# Patient Record
Sex: Male | Born: 1940 | Race: Black or African American | Hispanic: No | Marital: Married | Smoking: Never smoker
Health system: Southern US, Community
[De-identification: ages and names within clinical notes are randomized; demographics above are authoritative.]

## PROBLEM LIST (undated history)

## (undated) DIAGNOSIS — Z9221 Personal history of antineoplastic chemotherapy: Secondary | ICD-10-CM

## (undated) DIAGNOSIS — Z8719 Personal history of other diseases of the digestive system: Secondary | ICD-10-CM

## (undated) DIAGNOSIS — Z923 Personal history of irradiation: Secondary | ICD-10-CM

## (undated) DIAGNOSIS — C801 Malignant (primary) neoplasm, unspecified: Secondary | ICD-10-CM

## (undated) DIAGNOSIS — C20 Malignant neoplasm of rectum: Secondary | ICD-10-CM

## (undated) HISTORY — PX: OTHER SURGICAL HISTORY: SHX169

---

## 2016-02-26 DIAGNOSIS — K4090 Unilateral inguinal hernia, without obstruction or gangrene, not specified as recurrent: Secondary | ICD-10-CM | POA: Diagnosis not present

## 2016-02-26 DIAGNOSIS — R1031 Right lower quadrant pain: Secondary | ICD-10-CM | POA: Diagnosis not present

## 2016-03-05 DIAGNOSIS — K4090 Unilateral inguinal hernia, without obstruction or gangrene, not specified as recurrent: Secondary | ICD-10-CM | POA: Diagnosis not present

## 2016-03-05 HISTORY — PX: HERNIA REPAIR: SHX51

## 2017-07-21 DIAGNOSIS — I10 Essential (primary) hypertension: Secondary | ICD-10-CM | POA: Diagnosis not present

## 2017-07-21 DIAGNOSIS — N5201 Erectile dysfunction due to arterial insufficiency: Secondary | ICD-10-CM | POA: Diagnosis not present

## 2017-08-11 DIAGNOSIS — Z6828 Body mass index (BMI) 28.0-28.9, adult: Secondary | ICD-10-CM | POA: Diagnosis not present

## 2017-08-11 DIAGNOSIS — Z0000 Encounter for general adult medical examination without abnormal findings: Secondary | ICD-10-CM | POA: Diagnosis not present

## 2017-08-11 DIAGNOSIS — R030 Elevated blood-pressure reading, without diagnosis of hypertension: Secondary | ICD-10-CM | POA: Diagnosis not present

## 2018-04-04 ENCOUNTER — Other Ambulatory Visit: Payer: Self-pay | Admitting: Surgery

## 2018-04-04 DIAGNOSIS — C20 Malignant neoplasm of rectum: Secondary | ICD-10-CM

## 2018-04-06 DIAGNOSIS — Z800 Family history of malignant neoplasm of digestive organs: Secondary | ICD-10-CM | POA: Diagnosis not present

## 2018-04-06 DIAGNOSIS — Z808 Family history of malignant neoplasm of other organs or systems: Secondary | ICD-10-CM

## 2018-04-06 DIAGNOSIS — Z803 Family history of malignant neoplasm of breast: Secondary | ICD-10-CM

## 2018-04-06 DIAGNOSIS — C20 Malignant neoplasm of rectum: Secondary | ICD-10-CM

## 2018-04-11 ENCOUNTER — Ambulatory Visit
Admission: RE | Admit: 2018-04-11 | Discharge: 2018-04-11 | Disposition: A | Discharge status: Home / Self Care | Payer: Medicare Other | Source: Ambulatory Visit | Attending: Surgery | Admitting: Surgery

## 2018-04-11 DIAGNOSIS — C20 Malignant neoplasm of rectum: Secondary | ICD-10-CM

## 2018-04-11 MED ORDER — GADOBENATE DIMEGLUMINE 529 MG/ML IV SOLN
18.0000 mL | Freq: Once | INTRAVENOUS | Status: AC | PRN
  Administered 2018-04-11: 18 mL via INTRAVENOUS

## 2018-04-14 ENCOUNTER — Other Ambulatory Visit: Payer: Self-pay

## 2018-05-08 DIAGNOSIS — C20 Malignant neoplasm of rectum: Secondary | ICD-10-CM

## 2018-05-22 DIAGNOSIS — C20 Malignant neoplasm of rectum: Secondary | ICD-10-CM

## 2018-06-13 ENCOUNTER — Ambulatory Visit: Payer: Self-pay | Admitting: General Surgery

## 2018-06-13 NOTE — H&P (Signed)
History of Present Illness Thomas Ruff MD; 6/76/7209 11:48 AM) The patient is a 77 year old male who presents with colorectal cancer. 77 year old male who presents to the office for evaluation of recently diagnosed rectal cancer. He underwent a colonoscopy in June 2019 which showed a distal rectal mass approximately 2-3 cm from the anal verge. Biopsy show adenocarcinoma. CEA was elevated at 6.4. CT scan show no signs of metastatic disease. It does not appear that the tumor was tattooed. He underwent an MRI which showed a T3 N0 mass with no externalization of the tumor outside of the mesorectal envelope. This mass appears to be at the level of the prostate and is most invasive at the right posterior border. He denies any urinary symptoms. He reports some mild constipation and denies any rectal bleeding currently. He has noticed a 25 pound weight loss over the past few months which she attributes to decrease in appetite during chemotherapy.   Diagnostic Studies History (Thomas Mejia, Thomas Mejia; 06/13/2018 11:16 AM) Colonoscopy within last year  Allergies (Thomas Mejia, Thomas Mejia; 06/13/2018 11:17 AM) No Known Drug Allergies [06/13/2018]: Allergies Reconciled  Medication History (Thomas Mejia, Thomas Mejia; 06/13/2018 11:19 AM) Fish Oil (1000MG  Capsule, Oral) Active. Garlic (100MG  Tablet, Oral) Active. Magnesium (400MG  Capsule, Oral) Active. Multi-Day (Oral) Active. Vitamin B-12 (1000MCG Tablet, Oral) Active. Medications Reconciled  Social History (Thomas Mejia, [name]; 06/13/2018 11:16 AM) Caffeine use Carbonated beverages. No alcohol use No drug use Tobacco use Never smoker.  Other Problems (Thomas Mejia, Thomas Mejia; 06/13/2018 11:16 AM) Rectal Cancer     Review of Systems (Thomas A. Thomas Mejia; 06/13/2018 11:16 AM) General Present- Appetite Loss and Weight Loss. Not Present- Chills, Fatigue, Fever, Night Sweats and Weight Gain. Skin Present- Dryness. Not Present-  Change in Wart/Mole, Hives, Jaundice, New Lesions, Non-Healing Wounds, Rash and Ulcer. HEENT Present- Hoarseness, Seasonal Allergies and Sore Throat. Not Present- Earache, Hearing Loss, Nose Bleed, Oral Ulcers, Ringing in the Ears, Sinus Pain, Visual Disturbances, Wears glasses/contact lenses and Yellow Eyes. Respiratory Not Present- Bloody sputum, Chronic Cough, Difficulty Breathing, Snoring and Wheezing. Breast Not Present- Breast Mass, Breast Pain, Nipple Discharge and Skin Changes. Cardiovascular Not Present- Chest Pain, Difficulty Breathing Lying Down, Leg Cramps, Palpitations, Rapid Heart Rate, Shortness of Breath and Swelling of Extremities. Gastrointestinal Present- Change in Bowel Habits. Not Present- Abdominal Pain, Bloating, Bloody Stool, Chronic diarrhea, Constipation, Difficulty Swallowing, Excessive gas, Gets full quickly at meals, Hemorrhoids, Indigestion, Nausea, Rectal Pain and Vomiting. Male Genitourinary Not Present- Blood in Urine, Change in Urinary Stream, Frequency, Impotence, Nocturia, Painful Urination, Urgency and Urine Leakage. Musculoskeletal Not Present- Back Pain, Joint Pain, Joint Stiffness, Muscle Pain, Muscle Weakness and Swelling of Extremities. Neurological Not Present- Decreased Memory, Fainting, Headaches, Numbness, Seizures, Tingling, Tremor, Trouble walking and Weakness. Psychiatric Not Present- Anxiety, Bipolar, Change in Sleep Pattern, Depression, Fearful and Frequent crying. Endocrine Not Present- Cold Intolerance, Excessive Hunger, Hair Changes, Heat Intolerance, Hot flashes and New Diabetes. Hematology Not Present- Blood Thinners, Easy Bruising, Excessive bleeding, Gland problems, HIV and Persistent Infections.  Vitals (Thomas A. Thomas Mejia; 06/13/2018 11:17 AM) 06/13/2018 11:17 AM Weight: 173.4 lb Height: 73in Body Surface Area: 2.03 m Body Mass Index: 22.88 kg/m  Temp.: 98.58F  Pulse: 90 (Regular)  BP: 128/72 (Sitting, Left Arm,  Standard)      Physical Exam Thomas Ruff MD; 4/70/9628 11:49 AM)  General Mental Status-Alert. General Appearance-Not in acute distress. Build & Nutrition-Well nourished. Posture-Normal posture. Gait-Normal.  Head and Neck Head-normocephalic, atraumatic  with no lesions or palpable masses. Trachea-midline.  Chest and Lung Exam Chest and lung exam reveals -on auscultation, normal breath sounds, no adventitious sounds and normal vocal resonance.  Cardiovascular Cardiovascular examination reveals -normal heart sounds, regular rate and rhythm with no murmurs and no digital clubbing, cyanosis, edema, increased warmth or tenderness.  Abdomen Inspection Inspection of the abdomen reveals - No Hernias. Palpation/Percussion Palpation and Percussion of the abdomen reveal - Soft, Non Tender, No Rigidity (guarding), No hepatosplenomegaly and No Palpable abdominal masses.  Rectal Anorectal Exam Internal - normal sphincter tone. Note: No mass palpable to approximately 3 cm proximal to the anal verge.  Neurologic Neurologic evaluation reveals -alert and oriented x 3 with no impairment of recent or remote memory, normal attention span and ability to concentrate, normal sensation and normal coordination.  Musculoskeletal Normal Exam - Bilateral-Upper Extremity Strength Normal and Lower Extremity Strength Normal.    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 He completed his radiation and chemotherapy on 06/01/2018. He has lost some weight during his treatment which she is working to gain back. Otherwise he is relatively healthy. We discussed the surgery in detail including potential complications and postoperative quality of life. He is elected to proceed with low anterior resection. He is aware that he will most likely need a  diverting ostomy temporarily. We also specifically discussed the risk of damage to the prostate urethra and nerves surrounding the area which could lead to injuries in this area as well as nerve damage causing sexual dysfunction and bladder dysfunction. We will plan on scheduling the surgery in the beginning of October.  The surgery and anatomy were described to the patient as well as the risks of surgery and the possible complications. These include: Bleeding, deep abdominal infections and possible wound complications such as hernia and infection, damage to adjacent structures, leak of surgical connections, which can lead to other surgeries and possibly an ostomy, possible need for other procedures, such as abscess drains in radiology, possible prolonged hospital stay, possible diarrhea from removal of part of the colon, possible constipation from narcotics, possible bowel, bladder or sexual dysfunction if having rectal surgery, prolonged fatigue/weakness or appetite loss, possible early recurrence of of disease, possible complications of their medical problems such as heart disease or arrhythmias or lung problems, death (less than 1%). I believe the patient understands and wishes to proceed with the surgery.

## 2018-06-29 DIAGNOSIS — Z923 Personal history of irradiation: Secondary | ICD-10-CM

## 2018-06-29 DIAGNOSIS — Z9221 Personal history of antineoplastic chemotherapy: Secondary | ICD-10-CM

## 2018-06-29 DIAGNOSIS — C20 Malignant neoplasm of rectum: Secondary | ICD-10-CM | POA: Diagnosis not present

## 2018-07-06 NOTE — Patient Instructions (Addendum)
Thomas Mejia  07/06/2018   Your procedure is scheduled on: 07-21-18    Report to [facility] Main  Entrance    Report to Admitting at 7:00 AM    Call this number if you have problems the morning of surgery [phone]    Remember: DRINK 2 PRESURGERY ENSURE DRINKS THE NIGHT BEFORE SURGERY AT  1000 PM AND 1 PRESURGERY DRINK THE DAY OF THE PROCEDURE 3 HOURS PRIOR TO SCHEDULED SURGERY. NO SOLIDS AFTER MIDNIGHT THE DAY PRIOR TO THE SURGERY. NOTHING BY MOUTH EXCEPT CLEAR LIQUIDS UNTIL THREE HOURS PRIOR TO SCHEDULED SURGERY. PLEASE FINISH PRESURGERY ENSURE DRINK PER SURGEON ORDER 3 HOURS PRIOR TO SCHEDULED SURGERY TIME WHICH NEEDS TO BE COMPLETED AT 6:00 AM .     CLEAR LIQUID DIET   Foods Allowed                                                                     Foods Excluded  Coffee and tea, regular and decaf                             liquids that you cannot  Plain Jell-O in any flavor                                             see through such as: Fruit ices (not with fruit pulp)                                     milk, soups, orange juice  Iced Popsicles                                    All solid food Carbonated beverages, regular and diet                                    Cranberry, grape and apple juices Sports drinks like Gatorade Lightly seasoned clear broth or consume(fat free) Sugar, honey syrup  Sample Menu Breakfast                                Lunch                                     Supper Cranberry juice                    Beef broth                            Chicken broth Jell-O  Grape juice                           Apple juice Coffee or tea                        Jell-O                                      Popsicle                                                Coffee or tea                        Coffee or tea  _____________________________________________________________________     BRUSH  YOUR TEETH MORNING OF SURGERY AND RINSE YOUR MOUTH OUT, NO CHEWING GUM CANDY OR MINTS.     Take these medicines the morning of surgery with A SIP OF WATER: You may use your Gabapentin, prn. You may also bring and use your eyedrops                                You may not have any metal on your body including hair pins and              piercings  Do not wear jewelry, lotions, powders, cologne or deodorant             Men may shave face and neck.   Do not bring valuables to the hospital. [place].  Contacts, dentures or bridgework may not be worn into surgery.  Leave suitcase in the car. After surgery it may be brought to your room.      Special Instructions: Please consume a Clear Liquid Diet the Day of your prep, per your surgeon's instructions              Please read over the following fact sheets you were given: _____________________________________________________________________  [facility] - Preparing for Surgery Before surgery, you can play an important role.  Because skin is not sterile, your skin needs to be as free of germs as possible.  You can reduce the number of germs on your skin by washing with CHG (chlorahexidine gluconate) soap before surgery.  CHG is an antiseptic cleaner which kills germs and bonds with the skin to continue killing germs even after washing. Please DO NOT use if you have an allergy to CHG or antibacterial soaps.  If your skin becomes reddened/irritated stop using the CHG and inform your nurse when you arrive at Short Stay. Do not shave (including legs and underarms) for at least 48 hours prior to the first CHG shower.  You may shave your face/neck. Please follow these instructions carefully:  1.  Shower with CHG Soap the night before surgery and the  morning of Surgery.  2.  If you choose to wash your hair, wash your hair first as usual with your  normal  shampoo.  3.  After you shampoo, rinse your  hair and body  thoroughly to remove the  shampoo.                           4.  Use CHG as you would any other liquid soap.  You can apply chg directly  to the skin and wash                       Gently with a scrungie or clean washcloth.  5.  Apply the CHG Soap to your body ONLY FROM THE NECK DOWN.   Do not use on face/ open                           Wound or open sores. Avoid contact with eyes, ears mouth and genitals (private parts).                       Wash face,  Genitals (private parts) with your normal soap.             6.  Wash thoroughly, paying special attention to the area where your surgery  will be performed.  7.  Thoroughly rinse your body with warm water from the neck down.  8.  DO NOT shower/wash with your normal soap after using and rinsing off  the CHG Soap.                9.  Pat yourself dry with a clean towel.            10.  Wear clean pajamas.            11.  Place clean sheets on your bed the night of your first shower and do not  sleep with pets. Day of Surgery : Do not apply any lotions/deodorants the morning of surgery.  Please wear clean clothes to the hospital/surgery center.  FAILURE TO FOLLOW THESE INSTRUCTIONS MAY RESULT IN THE CANCELLATION OF YOUR SURGERY PATIENT SIGNATURE_________________________________  NURSE SIGNATURE__________________________________  ________________________________________________________________________   Adam Phenix  An incentive spirometer is a tool that can help keep your lungs clear and active. This tool measures how well you are filling your lungs with each breath. Taking long deep breaths may help reverse or decrease the chance of developing breathing (pulmonary) problems (especially infection) following:  A long period of time when you are unable to move or be active. BEFORE THE PROCEDURE   If the spirometer includes an indicator to show your best effort, your nurse or respiratory therapist will set it to a  desired goal.  If possible, sit up straight or lean slightly forward. Try not to slouch.  Hold the incentive spirometer in an upright position. INSTRUCTIONS FOR USE  1. Sit on the edge of your bed if possible, or sit up as far as you can in bed or on a chair. 2. Hold the incentive spirometer in an upright position. 3. Breathe out normally. 4. Place the mouthpiece in your mouth and seal your lips tightly around it. 5. Breathe in slowly and as deeply as possible, raising the piston or the ball toward the top of the column. 6. Hold your breath for 3-5 seconds or for as long as possible. Allow the piston or ball to fall to the bottom of the column. 7. Remove the mouthpiece from your mouth and breathe out normally. 8. Rest for a few seconds and repeat Steps 1 through 7  at least 10 times every 1-2 hours when you are awake. Take your time and take a few normal breaths between deep breaths. 9. The spirometer may include an indicator to show your best effort. Use the indicator as a goal to work toward during each repetition. 10. After each set of 10 deep breaths, practice coughing to be sure your lungs are clear. If you have an incision (the cut made at the time of surgery), support your incision when coughing by placing a pillow or rolled up towels firmly against it. Once you are able to get out of bed, walk around indoors and cough well. You may stop using the incentive spirometer when instructed by your caregiver.  RISKS AND COMPLICATIONS  Take your time so you do not get dizzy or light-headed.  If you are in pain, you may need to take or ask for pain medication before doing incentive spirometry. It is harder to take a deep breath if you are having pain. AFTER USE  Rest and breathe slowly and easily.  It can be helpful to keep track of a log of your progress. Your caregiver can provide you with a simple table to help with this. If you are using the spirometer at home, follow these  instructions: [place] IF:   You are having difficultly using the spirometer.  You have trouble using the spirometer as often as instructed.  Your pain medication is not giving enough relief while using the spirometer.  You develop fever of 100.5 F (38.1 C) or higher. SEEK IMMEDIATE MEDICAL CARE IF:   You cough up bloody sputum that had not been present before.  You develop fever of 102 F (38.9 C) or greater.  You develop worsening pain at or near the incision site. MAKE SURE YOU:   Understand these instructions.  Will watch your condition.  Will get help right away if you are not doing well or get worse. Document Released: 02/14/2007 Document Revised: 12/27/2011 Document Reviewed: 04/17/2007 Exit[employer] Patient Information 2014 Exit[employer], [place].   ________________________________________________________________________             WHAT IS A BLOOD TRANSFUSION? Blood Transfusion Information  A transfusion is the replacement of blood or some of its parts. Blood is made up of multiple cells which provide different functions.  Red blood cells carry oxygen and are used for blood loss replacement.  White blood cells fight against infection.  Platelets control bleeding.  Plasma helps clot blood.  Other blood products are available for specialized needs, such as hemophilia or other clotting disorders. BEFORE THE TRANSFUSION  Who gives blood for transfusions?   Healthy volunteers who are fully evaluated to make sure their blood is safe. This is blood bank blood. Transfusion therapy is the safest it has ever been in the practice of medicine. Before blood is taken from a donor, a complete history is taken to make sure that person has no history of diseases nor engages in risky social behavior (examples are intravenous drug use or sexual activity with multiple partners). The donor's travel history is screened to minimize risk of transmitting infections, such as malaria.  The donated blood is tested for signs of infectious diseases, such as HIV and hepatitis. The blood is then tested to be sure it is compatible with you in order to minimize the chance of a transfusion reaction. If you or a relative donates blood, this is often done in anticipation of surgery and is not appropriate for emergency situations. It takes  many days to process the donated blood. RISKS AND COMPLICATIONS Although transfusion therapy is very safe and saves many lives, the main dangers of transfusion include:   Getting an infectious disease.  Developing a transfusion reaction. This is an allergic reaction to something in the blood you were given. Every precaution is taken to prevent this. The decision to have a blood transfusion has been considered carefully by your caregiver before blood is given. Blood is not given unless the benefits outweigh the risks. AFTER THE TRANSFUSION  Right after receiving a blood transfusion, you will usually feel much better and more energetic. This is especially true if your red blood cells have gotten low (anemic). The transfusion raises the level of the red blood cells which carry oxygen, and this usually causes an energy increase.  The nurse administering the transfusion will monitor you carefully for complications. HOME CARE INSTRUCTIONS  No special instructions are needed after a transfusion. You may find your energy is better. Speak with your caregiver about any limitations on activity for underlying diseases you may have. SEEK MEDICAL CARE IF:   Your condition is not improving after your transfusion.  You develop redness or irritation at the intravenous (IV) site. SEEK IMMEDIATE MEDICAL CARE IF:  Any of the following symptoms occur over the next 12 hours:  Shaking chills.  You have a temperature by mouth above 102 F (38.9 C), not controlled by medicine.  Chest, back, or muscle pain.  People around you feel you are not acting correctly or are  confused.  Shortness of breath or difficulty breathing.  Dizziness and fainting.  You get a rash or develop hives.  You have a decrease in urine output.  Your urine turns a dark color or changes to pink, red, or brown. Any of the following symptoms occur over the next 10 days:  You have a temperature by mouth above 102 F (38.9 C), not controlled by medicine.  Shortness of breath.  Weakness after normal activity.  The white part of the eye turns yellow (jaundice).  You have a decrease in the amount of urine or are urinating less often.  Your urine turns a dark color or changes to pink, red, or brown. Document Released: 10/01/2000 Document Revised: 12/27/2011 Document Reviewed: 05/20/2008 [facility] Patient Information 2014 [place], [place].  _______________________________________________________________________

## 2018-07-11 ENCOUNTER — Encounter (HOSPITAL_COMMUNITY): Payer: Self-pay

## 2018-07-11 ENCOUNTER — Other Ambulatory Visit: Payer: Self-pay

## 2018-07-11 ENCOUNTER — Encounter (HOSPITAL_COMMUNITY)
Admission: RE | Admit: 2018-07-11 | Discharge: 2018-07-11 | Disposition: A | Discharge status: Home / Self Care | Payer: Medicare Other | Source: Ambulatory Visit | Attending: General Surgery | Admitting: General Surgery

## 2018-07-11 DIAGNOSIS — Z01818 Encounter for other preprocedural examination: Secondary | ICD-10-CM | POA: Insufficient documentation

## 2018-07-11 DIAGNOSIS — C20 Malignant neoplasm of rectum: Secondary | ICD-10-CM | POA: Diagnosis not present

## 2018-07-11 HISTORY — DX: Malignant (primary) neoplasm, unspecified: C80.1

## 2018-07-11 LAB — BASIC METABOLIC PANEL
ANION GAP: 7 (ref 5–15)
BUN: 16 mg/dL (ref 8–23)
CALCIUM: 9.1 mg/dL (ref 8.9–10.3)
CO2: 26 mmol/L (ref 22–32)
Chloride: 110 mmol/L (ref 98–111)
Creatinine, Ser: 0.92 mg/dL (ref 0.61–1.24)
GFR calc Af Amer: >60 mL/min (ref >60)
GLUCOSE: 104 mg/dL — AB (ref 70–99)
POTASSIUM: 3.9 mmol/L (ref 3.5–5.1)
SODIUM: 143 mmol/L (ref 135–145)

## 2018-07-11 LAB — CBC
HCT: 36.4 % — ABNORMAL LOW (ref 39.0–52.0)
HEMOGLOBIN: 11.5 g/dL — AB (ref 13.0–17.0)
MCH: 27.1 pg (ref 26.0–34.0)
MCHC: 31.6 g/dL (ref 30.0–36.0)
MCV: 85.8 fL (ref 78.0–100.0)
Platelets: 189 10*3/uL (ref 150–400)
RBC: 4.24 MIL/uL (ref 4.22–5.81)
RDW: 24.3 % — AB (ref 11.5–15.5)
WBC: 3 10*3/uL — AB (ref 4.0–10.5)

## 2018-07-11 NOTE — Consult Note (Addendum)
[place] Nurse requested for preoperative stoma site marking  Discussed surgical procedure and stoma creation with patient.  Explained role of the [facility] nurse team.  Provided the patient with educational booklet and provided samples of pouching options.  Answered patient's questions.   Examined patient sitting, and standing in order to place the marking in the patient's visual field, away from any creases or abdominal contour issues and within the rectus muscle.  Attempted to mark below the patient's belt line; this was not possible and marks had to be placed higher on the abd related to several creases which occur when pt leans forward and should be avoided if possible.   Marked for colostomy in the LLQ  __10__ cm to the left of the umbilicus and __3__GH above the umbilicus.  Marked for ileostomy in the RLQ  _10___cm to the right of the umbilicus and  __8__ cm above the umbilicus.  Patient's abdomen cleansed with CHG wipes at site markings, allowed to air dry prior to marking.Covered mark with thin film transparent dressings to preserve mark until date of surgery. Provided patient with marking pen to re-color in the sites if they begin to fade before the surgery.  [place] Nurse team will follow up with patient after surgery for continue ostomy care and teaching.   Julien Girt MSN, RN, [place], [place], [place]

## 2018-07-12 LAB — ABO/RH: ABO/RH(D): O POS

## 2018-07-20 MED ORDER — BUPIVACAINE LIPOSOME 1.3 % IJ SUSP
20.0000 mL | Freq: Once | INTRAMUSCULAR | Status: DC
  Filled 2018-07-20: qty 20

## 2018-07-21 ENCOUNTER — Inpatient Hospital Stay (HOSPITAL_COMMUNITY): Payer: Medicare Other | Admitting: Certified Registered Nurse Anesthetist

## 2018-07-21 ENCOUNTER — Encounter (HOSPITAL_COMMUNITY): Payer: Self-pay | Admitting: Certified Registered Nurse Anesthetist

## 2018-07-21 ENCOUNTER — Other Ambulatory Visit: Payer: Self-pay

## 2018-07-21 ENCOUNTER — Inpatient Hospital Stay (HOSPITAL_COMMUNITY)
Admission: RE | Admit: 2018-07-21 | Discharge: 2018-07-26 | DRG: 330 | Disposition: A | Discharge status: Home Health | Payer: Medicare Other | Source: Ambulatory Visit | Attending: General Surgery | Admitting: General Surgery

## 2018-07-21 ENCOUNTER — Encounter (HOSPITAL_COMMUNITY)
Admission: RE | Disposition: A | Discharge status: Home Health | Payer: Self-pay | Source: Ambulatory Visit | Attending: General Surgery

## 2018-07-21 DIAGNOSIS — D62 Acute posthemorrhagic anemia: Secondary | ICD-10-CM | POA: Diagnosis present

## 2018-07-21 DIAGNOSIS — Y842 Radiological procedure and radiotherapy as the cause of abnormal reaction of the patient, or of later complication, without mention of misadventure at the time of the procedure: Secondary | ICD-10-CM | POA: Diagnosis present

## 2018-07-21 DIAGNOSIS — I959 Hypotension, unspecified: Secondary | ICD-10-CM | POA: Diagnosis not present

## 2018-07-21 DIAGNOSIS — C20 Malignant neoplasm of rectum: Secondary | ICD-10-CM | POA: Diagnosis present

## 2018-07-21 DIAGNOSIS — K567 Ileus, unspecified: Secondary | ICD-10-CM | POA: Diagnosis not present

## 2018-07-21 HISTORY — PX: PROCTOSCOPY: SHX2266

## 2018-07-21 HISTORY — PX: XI ROBOTIC ASSISTED LOWER ANTERIOR RESECTION: SHX6558

## 2018-07-21 SURGERY — RESECTION, RECTUM, LOW ANTERIOR, ROBOT-ASSISTED
Anesthesia: General

## 2018-07-21 MED ORDER — PHENYLEPHRINE HCL 10 MG/ML IJ SOLN
INTRAMUSCULAR | Status: AC
  Filled 2018-07-21: qty 1

## 2018-07-21 MED ORDER — ONDANSETRON HCL 4 MG/2ML IJ SOLN
INTRAMUSCULAR | Status: DC | PRN
  Administered 2018-07-21: 4 mg via INTRAVENOUS

## 2018-07-21 MED ORDER — ROCURONIUM BROMIDE 10 MG/ML (PF) SYRINGE
PREFILLED_SYRINGE | INTRAVENOUS | Status: AC
  Filled 2018-07-21: qty 10

## 2018-07-21 MED ORDER — DEXAMETHASONE SODIUM PHOSPHATE 10 MG/ML IJ SOLN
INTRAMUSCULAR | Status: DC | PRN
  Administered 2018-07-21: 8 mg via INTRAVENOUS

## 2018-07-21 MED ORDER — SODIUM CHLORIDE 0.9 % IV SOLN
2.0000 g | Freq: Two times a day (BID) | INTRAVENOUS | Status: AC
  Administered 2018-07-21: 2 g via INTRAVENOUS
  Filled 2018-07-21: qty 2

## 2018-07-21 MED ORDER — FENTANYL CITRATE (PF) 100 MCG/2ML IJ SOLN
INTRAMUSCULAR | Status: AC
  Filled 2018-07-21: qty 2

## 2018-07-21 MED ORDER — GABAPENTIN 300 MG PO CAPS
300.0000 mg | ORAL_CAPSULE | ORAL | Status: AC
  Administered 2018-07-21: 300 mg via ORAL
  Filled 2018-07-21: qty 1

## 2018-07-21 MED ORDER — ROCURONIUM BROMIDE 50 MG/5ML IV SOSY
PREFILLED_SYRINGE | INTRAVENOUS | Status: DC | PRN
  Administered 2018-07-21: 10 mg via INTRAVENOUS
  Administered 2018-07-21: 50 mg via INTRAVENOUS
  Administered 2018-07-21: 20 mg via INTRAVENOUS
  Administered 2018-07-21: 5 mg via INTRAVENOUS

## 2018-07-21 MED ORDER — PROPOFOL 10 MG/ML IV BOLUS
INTRAVENOUS | Status: AC
  Filled 2018-07-21: qty 20

## 2018-07-21 MED ORDER — FENTANYL CITRATE (PF) 100 MCG/2ML IJ SOLN: 25.0000 ug | INTRAMUSCULAR | Status: DC | PRN

## 2018-07-21 MED ORDER — GABAPENTIN 300 MG PO CAPS
300.0000 mg | ORAL_CAPSULE | Freq: Two times a day (BID) | ORAL | Status: DC
  Administered 2018-07-21 – 2018-07-26 (×11): 300 mg via ORAL
  Filled 2018-07-21 (×11): qty 1

## 2018-07-21 MED ORDER — ONDANSETRON HCL 4 MG/2ML IJ SOLN: 4.0000 mg | Freq: Once | INTRAMUSCULAR | Status: DC | PRN

## 2018-07-21 MED ORDER — BUPIVACAINE-EPINEPHRINE (PF) 0.5% -1:200000 IJ SOLN
INTRAMUSCULAR | Status: AC
  Filled 2018-07-21: qty 30

## 2018-07-21 MED ORDER — 0.9 % SODIUM CHLORIDE (POUR BTL) OPTIME
TOPICAL | Status: DC | PRN
  Administered 2018-07-21: 2000 mL

## 2018-07-21 MED ORDER — SUCCINYLCHOLINE CHLORIDE 200 MG/10ML IV SOSY
PREFILLED_SYRINGE | INTRAVENOUS | Status: AC
  Filled 2018-07-21: qty 10

## 2018-07-21 MED ORDER — KETAMINE HCL 10 MG/ML IJ SOLN
INTRAMUSCULAR | Status: AC
  Filled 2018-07-21: qty 1

## 2018-07-21 MED ORDER — KETAMINE HCL 10 MG/ML IJ SOLN
INTRAMUSCULAR | Status: DC | PRN
  Administered 2018-07-21: 40 mg via INTRAVENOUS

## 2018-07-21 MED ORDER — LIDOCAINE 2% (20 MG/ML) 5 ML SYRINGE
INTRAMUSCULAR | Status: AC
  Filled 2018-07-21: qty 5

## 2018-07-21 MED ORDER — METOPROLOL TARTRATE 5 MG/5ML IV SOLN: 5.0000 mg | Freq: Four times a day (QID) | INTRAVENOUS | Status: DC | PRN

## 2018-07-21 MED ORDER — LABETALOL HCL 5 MG/ML IV SOLN
INTRAVENOUS | Status: AC
  Filled 2018-07-21: qty 4

## 2018-07-21 MED ORDER — PROPOFOL 10 MG/ML IV BOLUS
INTRAVENOUS | Status: DC | PRN
  Administered 2018-07-21: 200 mg via INTRAVENOUS

## 2018-07-21 MED ORDER — DEXAMETHASONE SODIUM PHOSPHATE 10 MG/ML IJ SOLN
INTRAMUSCULAR | Status: AC
  Filled 2018-07-21: qty 1

## 2018-07-21 MED ORDER — HEPARIN SODIUM (PORCINE) 5000 UNIT/ML IJ SOLN
5000.0000 [IU] | Freq: Once | INTRAMUSCULAR | Status: AC
  Administered 2018-07-21: 5000 [IU] via SUBCUTANEOUS

## 2018-07-21 MED ORDER — SACCHAROMYCES BOULARDII 250 MG PO CAPS
250.0000 mg | ORAL_CAPSULE | Freq: Two times a day (BID) | ORAL | Status: DC
  Administered 2018-07-21 – 2018-07-26 (×11): 250 mg via ORAL
  Filled 2018-07-21 (×11): qty 1

## 2018-07-21 MED ORDER — FENTANYL CITRATE (PF) 250 MCG/5ML IJ SOLN
INTRAMUSCULAR | Status: DC | PRN
  Administered 2018-07-21 (×3): 50 ug via INTRAVENOUS
  Administered 2018-07-21: 100 ug via INTRAVENOUS
  Administered 2018-07-21: 50 ug via INTRAVENOUS

## 2018-07-21 MED ORDER — SUGAMMADEX SODIUM 200 MG/2ML IV SOLN
INTRAVENOUS | Status: AC
  Filled 2018-07-21: qty 2

## 2018-07-21 MED ORDER — ALUM & MAG HYDROXIDE-SIMETH 200-200-20 MG/5ML PO SUSP
30.0000 mL | Freq: Four times a day (QID) | ORAL | Status: DC | PRN
  Administered 2018-07-23 – 2018-07-25 (×3): 30 mL via ORAL
  Filled 2018-07-21 (×3): qty 30

## 2018-07-21 MED ORDER — HEPARIN SODIUM (PORCINE) 5000 UNIT/ML IJ SOLN
INTRAMUSCULAR | Status: AC
  Filled 2018-07-21: qty 1

## 2018-07-21 MED ORDER — BUPIVACAINE LIPOSOME 1.3 % IJ SUSP
INTRAMUSCULAR | Status: DC | PRN
  Administered 2018-07-21: 20 mL

## 2018-07-21 MED ORDER — ONDANSETRON HCL 4 MG/2ML IJ SOLN
4.0000 mg | Freq: Four times a day (QID) | INTRAMUSCULAR | Status: DC | PRN
  Administered 2018-07-21: 4 mg via INTRAVENOUS
  Filled 2018-07-21: qty 2

## 2018-07-21 MED ORDER — LIDOCAINE 2% (20 MG/ML) 5 ML SYRINGE
INTRAMUSCULAR | Status: DC | PRN
  Administered 2018-07-21: 100 mg via INTRAVENOUS

## 2018-07-21 MED ORDER — SUGAMMADEX SODIUM 200 MG/2ML IV SOLN
INTRAVENOUS | Status: DC | PRN
  Administered 2018-07-21: 200 mg via INTRAVENOUS

## 2018-07-21 MED ORDER — LACTATED RINGERS IV SOLN
INTRAVENOUS | Status: DC
  Administered 2018-07-21 (×3): via INTRAVENOUS

## 2018-07-21 MED ORDER — ONDANSETRON HCL 4 MG PO TABS: 4.0000 mg | ORAL_TABLET | Freq: Four times a day (QID) | ORAL | Status: DC | PRN

## 2018-07-21 MED ORDER — ENOXAPARIN SODIUM 40 MG/0.4ML ~~LOC~~ SOLN
40.0000 mg | SUBCUTANEOUS | Status: DC
  Administered 2018-07-22: 40 mg via SUBCUTANEOUS
  Filled 2018-07-21: qty 0.4

## 2018-07-21 MED ORDER — LABETALOL HCL 5 MG/ML IV SOLN
INTRAVENOUS | Status: DC | PRN
  Administered 2018-07-21 (×2): 2.5 mg via INTRAVENOUS

## 2018-07-21 MED ORDER — ALVIMOPAN 12 MG PO CAPS
12.0000 mg | ORAL_CAPSULE | Freq: Two times a day (BID) | ORAL | Status: DC
  Administered 2018-07-22 – 2018-07-23 (×3): 12 mg via ORAL
  Filled 2018-07-21 (×3): qty 1

## 2018-07-21 MED ORDER — PHENYLEPHRINE 40 MCG/ML (10ML) SYRINGE FOR IV PUSH (FOR BLOOD PRESSURE SUPPORT)
PREFILLED_SYRINGE | INTRAVENOUS | Status: DC | PRN
  Administered 2018-07-21 (×2): 80 ug via INTRAVENOUS

## 2018-07-21 MED ORDER — ACETAMINOPHEN 500 MG PO TABS
1000.0000 mg | ORAL_TABLET | Freq: Four times a day (QID) | ORAL | Status: DC
  Administered 2018-07-21 – 2018-07-26 (×18): 1000 mg via ORAL
  Filled 2018-07-21 (×20): qty 2

## 2018-07-21 MED ORDER — ALVIMOPAN 12 MG PO CAPS
12.0000 mg | ORAL_CAPSULE | ORAL | Status: AC
  Administered 2018-07-21: 12 mg via ORAL
  Filled 2018-07-21: qty 1

## 2018-07-21 MED ORDER — LIDOCAINE 2% (20 MG/ML) 5 ML SYRINGE
INTRAMUSCULAR | Status: DC | PRN
  Administered 2018-07-21: 1 mg/kg/h via INTRAVENOUS

## 2018-07-21 MED ORDER — ALBUMIN HUMAN 5 % IV SOLN
INTRAVENOUS | Status: DC | PRN
  Administered 2018-07-21: 10:00:00 via INTRAVENOUS

## 2018-07-21 MED ORDER — TRAMADOL HCL 50 MG PO TABS
50.0000 mg | ORAL_TABLET | Freq: Four times a day (QID) | ORAL | Status: DC | PRN
  Administered 2018-07-22: 50 mg via ORAL
  Filled 2018-07-21: qty 1

## 2018-07-21 MED ORDER — PHENYLEPHRINE 40 MCG/ML (10ML) SYRINGE FOR IV PUSH (FOR BLOOD PRESSURE SUPPORT)
PREFILLED_SYRINGE | INTRAVENOUS | Status: AC
  Filled 2018-07-21: qty 10

## 2018-07-21 MED ORDER — SODIUM CHLORIDE 0.9 % IV SOLN
2.0000 g | INTRAVENOUS | Status: AC
  Administered 2018-07-21: 2 g via INTRAVENOUS
  Filled 2018-07-21: qty 2

## 2018-07-21 MED ORDER — LACTATED RINGERS IR SOLN
Status: DC | PRN
  Administered 2018-07-21: 2000 mL

## 2018-07-21 MED ORDER — LIDOCAINE HCL 2 % IJ SOLN
INTRAMUSCULAR | Status: AC
  Filled 2018-07-21: qty 20

## 2018-07-21 MED ORDER — ENSURE SURGERY PO LIQD
237.0000 mL | Freq: Two times a day (BID) | ORAL | Status: DC
  Administered 2018-07-21 – 2018-07-26 (×10): 237 mL via ORAL
  Filled 2018-07-21 (×11): qty 237

## 2018-07-21 MED ORDER — ESMOLOL HCL 100 MG/10ML IV SOLN
INTRAVENOUS | Status: AC
  Filled 2018-07-21: qty 10

## 2018-07-21 MED ORDER — ESMOLOL HCL 100 MG/10ML IV SOLN
INTRAVENOUS | Status: DC | PRN
  Administered 2018-07-21 (×4): 20 mg via INTRAVENOUS

## 2018-07-21 MED ORDER — FENTANYL CITRATE (PF) 250 MCG/5ML IJ SOLN
INTRAMUSCULAR | Status: AC
  Filled 2018-07-21: qty 5

## 2018-07-21 MED ORDER — BUPIVACAINE-EPINEPHRINE (PF) 0.5% -1:200000 IJ SOLN
INTRAMUSCULAR | Status: DC | PRN
  Administered 2018-07-21: 30 mL

## 2018-07-21 MED ORDER — ALBUMIN HUMAN 5 % IV SOLN
INTRAVENOUS | Status: AC
  Filled 2018-07-21: qty 250

## 2018-07-21 MED ORDER — KCL IN DEXTROSE-NACL 20-5-0.45 MEQ/L-%-% IV SOLN
INTRAVENOUS | Status: DC
  Administered 2018-07-21 – 2018-07-24 (×5): via INTRAVENOUS
  Filled 2018-07-21 (×6): qty 1000

## 2018-07-21 MED ORDER — HYDROMORPHONE HCL 1 MG/ML IJ SOLN
0.5000 mg | INTRAMUSCULAR | Status: DC | PRN
  Administered 2018-07-25: 0.5 mg via INTRAVENOUS
  Filled 2018-07-21: qty 0.5

## 2018-07-21 MED ORDER — ONDANSETRON HCL 4 MG/2ML IJ SOLN
INTRAMUSCULAR | Status: AC
  Filled 2018-07-21: qty 2

## 2018-07-21 MED ORDER — ACETAMINOPHEN 500 MG PO TABS
1000.0000 mg | ORAL_TABLET | ORAL | Status: AC
  Administered 2018-07-21: 1000 mg via ORAL
  Filled 2018-07-21: qty 2

## 2018-07-21 SURGICAL SUPPLY — 111 items
BARRIER SKIN 2 3/4 (OSTOMY) ×3 IMPLANT
BARRIER SKIN 2 3/4 INCH (OSTOMY) ×1
BLADE EXTENDED COATED 6.5IN (ELECTRODE) IMPLANT
CANNULA REDUC XI 12-8 STAPL (CANNULA) ×1
CANNULA REDUC XI 12-8MM STAPL (CANNULA) ×1
CANNULA REDUCER 12-8 DVNC XI (CANNULA) ×2 IMPLANT
CATH ROBINSON RED A/P 20FR (CATHETERS) ×4 IMPLANT
CELLS DAT CNTRL 66122 CELL SVR (MISCELLANEOUS) IMPLANT
CLIP VESOLOCK LG 6/CT PURPLE (CLIP) IMPLANT
CLIP VESOLOCK MED 6/CT (CLIP) IMPLANT
COVER SURGICAL LIGHT HANDLE (MISCELLANEOUS) ×8 IMPLANT
COVER TIP SHEARS 8 DVNC (MISCELLANEOUS) ×2 IMPLANT
COVER TIP SHEARS 8MM DA VINCI (MISCELLANEOUS) ×2
DECANTER SPIKE VIAL GLASS SM (MISCELLANEOUS) IMPLANT
DERMABOND ADVANCED (GAUZE/BANDAGES/DRESSINGS) ×2
DERMABOND ADVANCED .7 DNX12 (GAUZE/BANDAGES/DRESSINGS) ×2 IMPLANT
DRAIN CHANNEL 19F RND (DRAIN) ×4 IMPLANT
DRAPE ARM DVNC X/XI (DISPOSABLE) ×8 IMPLANT
DRAPE COLUMN DVNC XI (DISPOSABLE) ×2 IMPLANT
DRAPE DA VINCI XI ARM (DISPOSABLE) ×8
DRAPE DA VINCI XI COLUMN (DISPOSABLE) ×2
DRAPE SURG IRRIG POUCH 19X23 (DRAPES) ×4 IMPLANT
DRSG OPSITE POSTOP 4X10 (GAUZE/BANDAGES/DRESSINGS) IMPLANT
DRSG OPSITE POSTOP 4X6 (GAUZE/BANDAGES/DRESSINGS) IMPLANT
DRSG OPSITE POSTOP 4X8 (GAUZE/BANDAGES/DRESSINGS) IMPLANT
DRSG TEGADERM 4X4.75 (GAUZE/BANDAGES/DRESSINGS) ×4 IMPLANT
ELECT PENCIL ROCKER SW 15FT (MISCELLANEOUS) ×4 IMPLANT
ELECT REM PT RETURN 15FT ADLT (MISCELLANEOUS) ×4 IMPLANT
ENDOLOOP SUT PDS II  0 18 (SUTURE)
ENDOLOOP SUT PDS II 0 18 (SUTURE) IMPLANT
EVACUATOR SILICONE 100CC (DRAIN) ×4 IMPLANT
GAUZE SPONGE 2X2 8PLY STRL LF (GAUZE/BANDAGES/DRESSINGS) ×2 IMPLANT
GAUZE SPONGE 4X4 12PLY STRL (GAUZE/BANDAGES/DRESSINGS) IMPLANT
GLOVE BIO SURGEON STRL SZ 6.5 (GLOVE) ×15 IMPLANT
GLOVE BIO SURGEON STRL SZ7.5 (GLOVE) ×8 IMPLANT
GLOVE BIO SURGEONS STRL SZ 6.5 (GLOVE) ×5
GLOVE BIOGEL PI IND STRL 6.5 (GLOVE) ×8 IMPLANT
GLOVE BIOGEL PI IND STRL 7.0 (GLOVE) ×16 IMPLANT
GLOVE BIOGEL PI INDICATOR 6.5 (GLOVE) ×8
GLOVE BIOGEL PI INDICATOR 7.0 (GLOVE) ×16
GLOVE INDICATOR 8.0 STRL GRN (GLOVE) ×8 IMPLANT
GOWN STRL REUS W/ TWL LRG LVL3 (GOWN DISPOSABLE) ×8 IMPLANT
GOWN STRL REUS W/TWL 2XL LVL3 (GOWN DISPOSABLE) ×16 IMPLANT
GOWN STRL REUS W/TWL LRG LVL3 (GOWN DISPOSABLE) ×8
GOWN STRL REUS W/TWL XL LVL3 (GOWN DISPOSABLE) ×16 IMPLANT
GRASPER ENDOPATH ANVIL 10MM (MISCELLANEOUS) IMPLANT
GRASPER SUT TROCAR 14GX15 (MISCELLANEOUS) IMPLANT
HOLDER FOLEY CATH W/STRAP (MISCELLANEOUS) ×4 IMPLANT
IRRIG SUCT STRYKERFLOW 2 WTIP (MISCELLANEOUS) ×4
IRRIGATION SUCT STRKRFLW 2 WTP (MISCELLANEOUS) ×2 IMPLANT
IRRIGATOR SUCT 8 DISP DVNC XI (IRRIGATION / IRRIGATOR) ×2 IMPLANT
IRRIGATOR SUCTION 8MM XI DISP (IRRIGATION / IRRIGATOR) ×2
KIT PROCEDURE DA VINCI SI (MISCELLANEOUS) ×2
KIT PROCEDURE DVNC SI (MISCELLANEOUS) ×2 IMPLANT
KIT SIGMOIDOSCOPE (SET/KITS/TRAYS/PACK) ×4 IMPLANT
NEEDLE INSUFFLATION 14GA 120MM (NEEDLE) ×4 IMPLANT
PACK CARDIOVASCULAR III (CUSTOM PROCEDURE TRAY) ×4 IMPLANT
PACK COLON (CUSTOM PROCEDURE TRAY) ×4 IMPLANT
PAD POSITIONING PINK XL (MISCELLANEOUS) ×4 IMPLANT
PORT LAP GEL ALEXIS MED 5-9CM (MISCELLANEOUS) IMPLANT
POUCH OSTOMY 2 3/4  H 3804 (WOUND CARE) ×2
POUCH OSTOMY 2 PC DRNBL 2.75 (WOUND CARE) ×2 IMPLANT
RTRCTR WOUND ALEXIS 18CM MED (MISCELLANEOUS)
SCISSORS LAP 5X35 DISP (ENDOMECHANICALS) ×4 IMPLANT
SEAL CANN UNIV 5-8 DVNC XI (MISCELLANEOUS) ×6 IMPLANT
SEAL XI 5MM-8MM UNIVERSAL (MISCELLANEOUS) ×6
SEALER VESSEL DA VINCI XI (MISCELLANEOUS) ×2
SEALER VESSEL EXT DVNC XI (MISCELLANEOUS) ×2 IMPLANT
SLEEVE ADV FIXATION 5X100MM (TROCAR) IMPLANT
SOLUTION ELECTROLUBE (MISCELLANEOUS) ×4 IMPLANT
SPONGE GAUZE 2X2 STER 10/PKG (GAUZE/BANDAGES/DRESSINGS) ×2
STAPLER 45 BLU RELOAD XI (STAPLE) IMPLANT
STAPLER 45 BLUE RELOAD XI (STAPLE)
STAPLER 45 GREEN RELOAD XI (STAPLE) ×6
STAPLER 45 GRN RELOAD XI (STAPLE) ×6 IMPLANT
STAPLER CANNULA SEAL DVNC XI (STAPLE) ×2 IMPLANT
STAPLER CANNULA SEAL XI (STAPLE) ×2
STAPLER CIRCULAR 29MM (STAPLE) ×4 IMPLANT
STAPLER SHEATH (SHEATH) ×4
STAPLER SHEATH ENDOWRIST DVNC (SHEATH) ×4 IMPLANT
STAPLER VISISTAT 35W (STAPLE) IMPLANT
SUT ETHILON 2 0 PS N (SUTURE) ×4 IMPLANT
SUT NOVA NAB DX-16 0-1 5-0 T12 (SUTURE) ×8 IMPLANT
SUT PROLENE 2 0 KS (SUTURE) ×4 IMPLANT
SUT SILK 0 SH 30 (SUTURE) ×4 IMPLANT
SUT SILK 2 0 (SUTURE) ×2
SUT SILK 2 0 SH CR/8 (SUTURE) ×4 IMPLANT
SUT SILK 2-0 18XBRD TIE 12 (SUTURE) ×2 IMPLANT
SUT SILK 3 0 (SUTURE) ×2
SUT SILK 3 0 SH CR/8 (SUTURE) ×4 IMPLANT
SUT SILK 3-0 18XBRD TIE 12 (SUTURE) ×2 IMPLANT
SUT V-LOC BARB 180 2/0GR6 GS22 (SUTURE)
SUT VIC AB 2-0 SH 18 (SUTURE) ×12 IMPLANT
SUT VIC AB 2-0 SH 27 (SUTURE) ×2
SUT VIC AB 2-0 SH 27X BRD (SUTURE) ×2 IMPLANT
SUT VIC AB 3-0 SH 18 (SUTURE) ×4 IMPLANT
SUT VIC AB 4-0 PS2 18 (SUTURE) ×8 IMPLANT
SUT VIC AB 4-0 PS2 27 (SUTURE) ×8 IMPLANT
SUT VICRYL 0 UR6 27IN ABS (SUTURE) ×4 IMPLANT
SUT VICRYL 4-0 PS2 18IN ABS (SUTURE) ×4 IMPLANT
SUTURE V-LC BRB 180 2/0GR6GS22 (SUTURE) IMPLANT
SYR 10ML ECCENTRIC (SYRINGE) ×4 IMPLANT
SYS LAPSCP GELPORT 120MM (MISCELLANEOUS)
SYSTEM LAPSCP GELPORT 120MM (MISCELLANEOUS) IMPLANT
TOWEL OR 17X26 10 PK STRL BLUE (TOWEL DISPOSABLE) ×4 IMPLANT
TOWEL OR NON WOVEN STRL DISP B (DISPOSABLE) ×4 IMPLANT
TRAY FOLEY MTR SLVR 16FR STAT (SET/KITS/TRAYS/PACK) ×4 IMPLANT
TROCAR ADV FIXATION 5X100MM (TROCAR) ×4 IMPLANT
TUBING CONNECTING 10 (TUBING) ×6 IMPLANT
TUBING CONNECTING 10' (TUBING) ×2
TUBING INSUFFLATION 10FT LAP (TUBING) ×4 IMPLANT

## 2018-07-21 NOTE — Op Note (Signed)
07/21/2018  11:45 AM  PATIENT:  Thomas Mejia  77 y.o. male  Patient Care Team: Lillard Anes, MD as PCP - General (Family Medicine)  PRE-OPERATIVE DIAGNOSIS:  rectal cancer  POST-OPERATIVE DIAGNOSIS:  rectal cancer  PROCEDURE:   XI ROBOTIC ASSISTED LOWER ANTERIOR RESECTION,  DIVERTING LOOP ILEOOSTOMY    Surgeon(s): Leighton Ruff, MD Ileana Roup, MD  ASSISTANT: Dr Dema Severin   ANESTHESIA:   local and general  EBL:338ml  Total I/O In: 2250 [I.V.:2000; IV Piggyback:250] Out: 600 [Urine:300; Blood:300]  Delay start of Pharmacological VTE agent (>24hrs) due to surgical blood loss or risk of bleeding:  no  DRAINS: (34F) Jackson-Pratt drain(s) with closed bulb suction in the pelvis   SPECIMEN: Rectosigmoid, final distal margin  DISPOSITION OF SPECIMEN:  PATHOLOGY  COUNTS:  YES  PLAN OF CARE: Admit to inpatient   PATIENT DISPOSITION:  PACU - hemodynamically stable.  INDICATION:    77 y.o. M with distal rectal cancer.  I recommended low anterior resection:  The anatomy & physiology of the digestive tract was discussed.  The pathophysiology was discussed.  Natural history risks without surgery was discussed.   I worked to give an overview of the disease and the frequent need to have multispecialty involvement.  I feel the risks of no intervention will lead to serious problems that outweigh the operative risks; therefore, I recommended a partial colectomy to remove the pathology.  Laparoscopic & open techniques were discussed.   Risks such as bleeding, infection, abscess, leak, reoperation, possible ostomy, hernia, heart attack, death, and other risks were discussed.  I noted a good likelihood this will help address the problem.   Goals of post-operative recovery were discussed as well.    The patient expressed understanding & wished to proceed with surgery.  OR FINDINGS:   Patient had mass located right anterior to right posterior distal rectum.  ~6cm from anal  verge  No obvious metastatic disease on visceral parietal peritoneum or liver.  The anastomosis rests 4 cm from the anal verge by rigid proctoscopy.  DESCRIPTION:   Informed consent was confirmed.  The patient underwent general anaesthesia without difficulty.  The patient was positioned appropriately.  VTE prevention in place.  The patient's abdomen was clipped, prepped, & draped in a sterile fashion.  Surgical timeout confirmed our plan.  The patient was positioned in reverse Trendelenburg.  Abdominal entry was gained using a Varies needle in LUQ.  Entry was clean.  I induced carbon dioxide insufflation.  An 73mm robotic port was placed in the RUQ.  Camera inspection revealed no injury.  Extra ports were carefully placed under direct laparoscopic visualization.  I laparoscopically reflected the greater omentum and the upper abdomen the small bowel in the upper abdomen. The patient was appropriately positioned and the robot was docked to the patient's left side.  Instruments were placed under direct visualization.    I mobilized the sigmoid colon off of the pelvic sidewall.  I scored the base of peritoneum of the right side of the mesentery of the left colon from the ligament of Treitz to the peritoneal reflection.   I elevated the sigmoid mesentery and enetered into the retro-mesenteric plane. We were able to identify the left ureter and gonadal vessels. We kept those posterior within the retroperitoneum and elevated the left colon mesentery off that. I did isolated IMA pedicle but did not ligate it yet.  I continued distally and got into the avascular plane posterior to the mesorectum. This allowed me  to help mobilize the rectum as well by freeing the mesorectum off the sacrum.  I mobilized the peritoneal coverings towards the peritoneal reflection on both the right and left sides of the rectum.  I could see the right and left ureters and stayed away from them.    I skeletonized the inferior  mesenteric artery pedicle.  I went down to its takeoff from the aorta.   I isolated the inferior mesenteric vein off of the ligament of Treitz just cephalad to that as well.  After confirming the left ureter was out of the way, I went ahead and ligated the inferior mesenteric artery pedicle with bipolar robotic vessel sealer ~2cm above its takeoff from the aorta.   I did ligate the inferior mesenteric vein in a similar fashion.  We ensured hemostasis. I mobilized the left colon in a lateral to medial fashion off the line of Toldt up towards the splenic flexure to ensure good mobilization of the left colon to reach into the pelvis.  I then began dissecting the posterior mesorectal plane.  This was done using scissors and electrocautery.  There was significant scar tissue in the right and left posterior lateral aspects of the mesorectum.  The plane was rather decimated in these areas.  I continue to mobilize laterally and divided the remaining peritoneal reflection.  I entered into the anterior space above the rectum and separated this from the prostate.  I then connected my 2 planes laterally.  I continued to mobilize down to the pelvic floor.  Once I was completely around the rectum circumferentially, I evaluated with a digital rectal exam.  There was a small defect in the right lateral rectal sidewall distal to the tumor.  Our dissection plane did extend down to the pelvic floor below this.  I was then able to transect the rectum using 3 green load robotic staplers at the pelvic floor.  This was distal to the defect and tumor.  Once this was completed I mobilized the remaining colon down into the pelvis.  This easily reached.  The remaining mesentery was divided using a robotic vessel sealer.  I then made my ileostomy defect after the robot was undocked.  A cruciate incision was made using electrocautery.  The rectus muscle was split and the peritoneum was entered.  We then placed an [place] wound protector and  brought out the specimen through the ileostomy site.  A pursestring device was placed over the colon at the previously marked site.  A 2-0 Prolene pursestring was placed.  The colon was transected.  There was good bleeding at the mucosal border.  A pursestring suture was tied over a 29 mm EEA anvil.  This was placed back into the abdomen and the abdomen was reinsufflated.  A coloanal anastomosis was created using the EEA stapler.  There was no leak when tested with insufflation under water.  There was no tension on the anastomosis.  A 19 [place] Blake drain was placed into the pelvis and brought out through the right lower quadrant port site.  The abdomen was irrigated.  Hemostasis was good.  The omentum was then brought down over the abdominal contents.  The terminal ileum was brought up through this incision and the proximal end was marked.  The wound protector and ports were removed.  The abdomen was desufflated.  We then switched to clean gowns, gloves, instruments and drapes.  The port sites were closed using 4-0 Vicryl sutures and Dermabond.  The ileostomy was then  matured in standard Brooke fashion using 2-0 Vicryl sutures over a rubber catheter.  An ostomy appliance was placed.  The patient was awakened from anesthesia and sent to the postanesthesia care unit stable condition.  All counts were correct per operating room staff.  An MD assistant was necessary for tissue manipulation, retraction and positioning due to the complexity of the case and hospital policies

## 2018-07-21 NOTE — Progress Notes (Signed)
Patient was transferred to 5W at 1325. Alert and oriented x4. Dressing intact, clean, no bleeding.

## 2018-07-21 NOTE — H&P (Signed)
The patient is a 77 year old male who presents with colorectal cancer. 77 year old male who presents to the office for evaluation of recently diagnosed rectal cancer. He underwent a colonoscopy in June 2019 which showed a distal rectal mass approximately 2-3 cm from the anal verge. Biopsy show adenocarcinoma. CEA was elevated at 6.4. CT scan show no signs of metastatic disease. It does not appear that the tumor was tattooed. He underwent an MRI which showed a T3 N0 mass with no externalization of the tumor outside of the mesorectal envelope. This mass appears to be at the level of the prostate and is most invasive at the right posterior border. He denies any urinary symptoms. He reports some mild constipation and denies any rectal bleeding currently. He has noticed a 25 pound weight loss over the past few months which she attributes to decrease in appetite during chemotherapy.   Diagnostic Studies History ([name], [place]; 06/13/2018 11:16 AM) Colonoscopy within last year  Allergies ([name], [place]; 06/13/2018 11:17 AM) No Known Drug Allergies [06/13/2018]: Allergies Reconciled  Medication History ([name], RMA; 06/13/2018 11:19 AM) Fish Oil (1000MG  Capsule, Oral) Active. Garlic (100MG  Tablet, Oral) Active. Magnesium (400MG  Capsule, Oral) Active. Multi-Day (Oral) Active. Vitamin B-12 (1000MCG Tablet, Oral) Active. Medications Reconciled  Social History ([name], [place]; 06/13/2018 11:16 AM) Caffeine use Carbonated beverages. No alcohol use No drug use Tobacco use Never smoker.  Other Problems ([name], [place]; 06/13/2018 11:16 AM) Rectal Cancer   Review of Systems  General Present- Appetite Loss and Weight Loss. Not Present- Chills, Fatigue, Fever, Night Sweats and Weight Gain. Skin Present- Dryness. Not Present- Change in Wart/Mole, Hives, Jaundice, New Lesions, Non-Healing Wounds, Rash and Ulcer. HEENT Present-  Hoarseness, Seasonal Allergies and Sore Throat. Not Present- Earache, Hearing Loss, Nose Bleed, Oral Ulcers, Ringing in the Ears, Sinus Pain, Visual Disturbances, Wears glasses/contact lenses and Yellow Eyes. Respiratory Not Present- Bloody sputum, Chronic Cough, Difficulty Breathing, Snoring and Wheezing. Breast Not Present- Breast Mass, Breast Pain, Nipple Discharge and Skin Changes. Cardiovascular Not Present- Chest Pain, Difficulty Breathing Lying Down, Leg Cramps, Palpitations, Rapid Heart Rate, Shortness of Breath and Swelling of Extremities. Gastrointestinal Present- Change in Bowel Habits. Not Present- Abdominal Pain, Bloating, Bloody Stool, Chronic diarrhea, Constipation, Difficulty Swallowing, Excessive gas, Gets full quickly at meals, Hemorrhoids, Indigestion, Nausea, Rectal Pain and Vomiting. Male Genitourinary Not Present- Blood in Urine, Change in Urinary Stream, Frequency, Impotence, Nocturia, Painful Urination, Urgency and Urine Leakage. Musculoskeletal Not Present- Back Pain, Joint Pain, Joint Stiffness, Muscle Pain, Muscle Weakness and Swelling of Extremities. Neurological Not Present- Decreased Memory, Fainting, Headaches, Numbness, Seizures, Tingling, Tremor, Trouble walking and Weakness. Psychiatric Not Present- Anxiety, Bipolar, Change in Sleep Pattern, Depression, Fearful and Frequent crying. Endocrine Not Present- Cold Intolerance, Excessive Hunger, Hair Changes, Heat Intolerance, Hot flashes and New Diabetes. Hematology Not Present- Blood Thinners, Easy Bruising, Excessive bleeding, Gland problems, HIV and Persistent Infections.  BP (!) 140/95   Pulse (!) 102   Temp 97.9 F (36.6 C) (Oral)   Resp 18   Ht 6\' 1"  (1.854 m)   Wt 85.4 kg   SpO2 100%   BMI 24.84 kg/m    Physical Exam   General Mental Status-Alert. General Appearance-Not in acute distress. Build & Nutrition-Well nourished. Posture-Normal posture. Gait-Normal.  Head and  Neck Head-normocephalic, atraumatic with no lesions or palpable masses. Trachea-midline.  Chest and Lung Exam Chest and lung exam reveals -on auscultation, normal breath sounds, no adventitious sounds and  normal vocal resonance.  Cardiovascular Cardiovascular examination reveals -normal heart sounds, regular rate and rhythm with no murmurs and no digital clubbing, cyanosis, edema, increased warmth or tenderness.  Abdomen Inspection Inspection of the abdomen reveals - No Hernias. Palpation/Percussion Palpation and Percussion of the abdomen reveal - Soft, Non Tender, No Rigidity (guarding), No hepatosplenomegaly and No Palpable abdominal masses.  Rectal Anorectal Exam Internal - normal sphincter tone. Note: No mass palpable to approximately 3 cm proximal to the anal verge.  Neurologic Neurologic evaluation reveals -alert and oriented x 3 with no impairment of recent or remote memory, normal attention span and ability to concentrate, normal sensation and normal coordination.  Musculoskeletal Normal Exam - Bilateral-Upper Extremity Strength Normal and Lower Extremity Strength Normal.    Assessment & Plan   RECTAL CANCER (C20) Impression: 77 year old male with distal rectal cancer who presents to the office for surgical evaluation. He completed his radiation and chemotherapy on 06/01/2018. He has lost some weight during his treatment which he is working to gain back. Otherwise he is relatively healthy. We discussed the surgery in detail including potential complications and postoperative quality of life. He is elected to proceed with low anterior resection. He is aware that he will most likely need a diverting ostomy temporarily. We also specifically discussed the risk of damage to the prostate urethra and nerves surrounding the area which could lead to injuries in this area as well as nerve damage causing sexual dysfunction and bladder dysfunction.  The surgery and  anatomy were described to the patient as well as the risks of surgery and the possible complications. These include: Bleeding, deep abdominal infections and possible wound complications such as hernia and infection, damage to adjacent structures, leak of surgical connections, which can lead to other surgeries and possibly an ostomy, possible need for other procedures, such as abscess drains in radiology, possible prolonged hospital stay, possible diarrhea from removal of part of the colon, possible constipation from narcotics, possible bowel, bladder or sexual dysfunction if having rectal surgery, prolonged fatigue/weakness or appetite loss, possible early recurrence of of disease, possible complications of their medical problems such as heart disease or arrhythmias or lung problems, death (less than 1%). I believe the patient understands and wishes to proceed with the surgery.

## 2018-07-21 NOTE — Anesthesia Preprocedure Evaluation (Addendum)
Anesthesia Evaluation  Patient identified by MRN, date of birth, ID band Patient awake    Reviewed: Allergy & Precautions, NPO status , Patient's Chart, lab work & pertinent test results  Airway Mallampati: III  TM Distance: >3 FB Neck ROM: Full    Dental no notable dental hx.    Pulmonary neg pulmonary ROS,    Pulmonary exam normal breath sounds clear to auscultation       Cardiovascular negative cardio ROS Normal cardiovascular exam Rhythm:Regular Rate:Normal     Neuro/Psych negative neurological ROS  negative psych ROS   GI/Hepatic negative GI ROS, Neg liver ROS,   Endo/Other  Port a cath on chemo  Renal/GU negative Renal ROS     Musculoskeletal negative musculoskeletal ROS (+)   Abdominal   Peds  Hematology  (+) anemia ,   Anesthesia Other Findings rectal cancer  Reproductive/Obstetrics                           Anesthesia Physical Anesthesia Plan  ASA: III  Anesthesia Plan: General   Post-op Pain Management:    Induction: Intravenous  PONV Risk Score and Plan: 2 and Ondansetron, Dexamethasone, Midazolam and Treatment may vary due to age or medical condition  Airway Management Planned: Oral ETT  Additional Equipment:   Intra-op Plan:   Post-operative Plan: Extubation in OR  Informed Consent: I have reviewed the patients History and Physical, chart, labs and discussed the procedure including the risks, benefits and alternatives for the proposed anesthesia with the patient or authorized representative who has indicated his/her understanding and acceptance.   Dental advisory given  Plan Discussed with: CRNA  Anesthesia Plan Comments:        Anesthesia Quick Evaluation

## 2018-07-21 NOTE — Anesthesia Postprocedure Evaluation (Signed)
Anesthesia Post Note  Patient: Thomas Mejia  Procedure(s) Performed: XI ROBOTIC ASSISTED LOWER ANTERIOR RESECTION  DIVERTING LOOP ILEOOSTOMY ERAS PATHWAY (N/A ) RIGID PROCTOSCOPY     Patient location during evaluation: PACU Anesthesia Type: General Level of consciousness: awake and alert Pain management: pain level controlled Vital Signs Assessment: post-procedure vital signs reviewed and stable Respiratory status: spontaneous breathing, nonlabored ventilation, respiratory function stable and patient connected to nasal cannula oxygen Cardiovascular status: blood pressure returned to baseline and stable Postop Assessment: no apparent nausea or vomiting Anesthetic complications: no    Last Vitals:  Vitals:   07/21/18 1300 07/21/18 1415  BP: 116/77 103/75  Pulse: 77 79  Resp: 17 16  Temp: 36.5 C 36.4 C  SpO2: 96% 100%    Last Pain:  Vitals:   07/21/18 1415  TempSrc: Axillary  PainSc:                  Karyl Kinnier Shevy Yaney

## 2018-07-21 NOTE — Anesthesia Procedure Notes (Signed)
Procedure Name: Intubation Date/Time: 07/21/2018 7:43 AM Performed by: Maxwell Caul, CRNA Pre-anesthesia Checklist: Patient identified, Emergency Drugs available, Suction available and Patient being monitored Patient Re-evaluated:Patient Re-evaluated prior to induction Oxygen Delivery Method: Circle system utilized Preoxygenation: Pre-oxygenation with 100% oxygen Induction Type: IV induction Ventilation: Mask ventilation without difficulty Laryngoscope Size: Mac and 4 Grade View: Grade I Tube type: Oral Tube size: 7.5 mm Number of attempts: 1 Airway Equipment and Method: Stylet Placement Confirmation: ETT inserted through vocal cords under direct vision,  positive ETCO2 and breath sounds checked- equal and bilateral Secured at: 21 cm Tube secured with: Tape Dental Injury: Teeth and Oropharynx as per pre-operative assessment

## 2018-07-21 NOTE — Transfer of Care (Signed)
Immediate Anesthesia Transfer of Care Note  Patient: Thomas Mejia  Procedure(s) Performed: XI ROBOTIC ASSISTED LOWER ANTERIOR RESECTION  DIVERTING LOOP ILEOOSTOMY ERAS PATHWAY (N/A ) RIGID PROCTOSCOPY  Patient Location: PACU  Anesthesia Type:General  Level of Consciousness: awake, alert  and oriented  Airway & Oxygen Therapy: Patient Spontanous Breathing and Patient connected to face mask oxygen  Post-op Assessment: Report given to RN and Post -op Vital signs reviewed and stable  Post vital signs: Reviewed and stable  Last Vitals:  Vitals Value Taken Time  BP 137/97 07/21/2018 12:00 PM  Temp    Pulse 82 07/21/2018 12:02 PM  Resp 14 07/21/2018 12:02 PM  SpO2 98 % 07/21/2018 12:02 PM  Vitals shown include unvalidated device data.  Last Pain:  Vitals:   07/21/18 0543  TempSrc: Oral      Patients Stated Pain Goal: 3 (61/68/37 2902)  Complications: No apparent anesthesia complications

## 2018-07-22 ENCOUNTER — Encounter (HOSPITAL_COMMUNITY): Payer: Self-pay | Admitting: General Surgery

## 2018-07-22 LAB — CBC
HCT: 29.5 % — ABNORMAL LOW (ref 39.0–52.0)
HEMOGLOBIN: 9.6 g/dL — AB (ref 13.0–17.0)
MCH: 28.1 pg (ref 26.0–34.0)
MCHC: 32.5 g/dL (ref 30.0–36.0)
MCV: 86.3 fL (ref 78.0–100.0)
Platelets: 159 10*3/uL (ref 150–400)
RBC: 3.42 MIL/uL — ABNORMAL LOW (ref 4.22–5.81)
RDW: 22.8 % — ABNORMAL HIGH (ref 11.5–15.5)
WBC: 7.8 10*3/uL (ref 4.0–10.5)

## 2018-07-22 LAB — BASIC METABOLIC PANEL
Anion gap: 19 — ABNORMAL HIGH (ref 5–15)
BUN: 18 mg/dL (ref 8–23)
CHLORIDE: 103 mmol/L (ref 98–111)
CO2: 14 mmol/L — ABNORMAL LOW (ref 22–32)
Calcium: 8.6 mg/dL — ABNORMAL LOW (ref 8.9–10.3)
Creatinine, Ser: 1.07 mg/dL (ref 0.61–1.24)
GFR calc Af Amer: >60 mL/min (ref >60)
Glucose, Bld: 157 mg/dL — ABNORMAL HIGH (ref 70–99)
POTASSIUM: 4.9 mmol/L (ref 3.5–5.1)
Sodium: 136 mmol/L (ref 135–145)

## 2018-07-22 LAB — HEMOGLOBIN AND HEMATOCRIT, BLOOD
HCT: 27 % — ABNORMAL LOW (ref 39.0–52.0)
HEMOGLOBIN: 8.7 g/dL — AB (ref 13.0–17.0)

## 2018-07-22 MED ORDER — SODIUM CHLORIDE 0.9 % IV BOLUS
500.0000 mL | Freq: Once | INTRAVENOUS | Status: AC
  Administered 2018-07-22: 500 mL via INTRAVENOUS

## 2018-07-22 NOTE — Progress Notes (Signed)
Pt blood pressure drops to 67/41 when standing. Pt became dizzy and sweaty. Dr. Harlow Asa Notified, new order for 562ml NS bolus obtained

## 2018-07-22 NOTE — Evaluation (Signed)
Physical Therapy Evaluation Patient Details Name: Thomas Mejia MRN: [id number] DOB: 1941-06-21 Today's Date: 07/22/2018   History of Present Illness  77 yo male with onset of rectal CA had a robotic LAR with coloanal anastomosis and diverting loop ileostomy on 07/21/18.    Clinical Impression  Pt was seen for evaluation of mobility and noted his BP's were declining with more upright postures.  Sitting was 100/63, but on standing was 67/41 with pulse 117 and O2 sat on room air 100%.  Pt is mod assist to control sitting and requires repetition of instructions for all transitions.  SNF recommended for both increasing endurance and to strengthen his trunk and legs for safer moving on RW.  Has been able to walk with no AD, so will anticipate his rapid recovery with rehab interventions.  Follow acutely for same goals.     Follow Up Recommendations SNF    Equipment Recommendations  Rolling walker with 5" wheels    Recommendations for Other Services       Precautions / Restrictions Precautions Precautions: Fall Precaution Comments: ck BP with movement, rectal drain and JP drain for ileostomy Restrictions Weight Bearing Restrictions: No      Mobility  Bed Mobility Overal bed mobility: Needs Assistance Bed Mobility: Supine to Sit;Sit to Supine     Supine to sit: Min assist Sit to supine: Mod assist      Transfers Overall transfer level: Needs assistance Equipment used: Rolling walker (2 wheeled);1 person hand held assist Transfers: Sit to/from Stand Sit to Stand: Min assist         General transfer comment: pt is hypotensive with standing and diaphoretic with effort, had to repeat instructions to sit  Ambulation/Gait             General Gait Details: sidestep x 2 for posture side of b ed but was severely hypotensive.  Stairs            Wheelchair Mobility    Modified Rankin (Stroke Patients Only)       Balance Overall balance assessment: Needs  assistance Sitting-balance support: Feet supported Sitting balance-Leahy Scale: Fair     Standing balance support: Bilateral upper extremity supported;During functional activity Standing balance-Leahy Scale: Poor Standing balance comment: standing balance impacted by pt's BP                             Pertinent Vitals/Pain Pain Assessment: 0-10 Pain Score: 3  Pain Location: post op pain Pain Descriptors / Indicators: Operative site guarding Pain Intervention(s): Premedicated before session;Monitored during session;Repositioned    Home Living Family/patient expects to be discharged to:: Private residence Living Arrangements: Spouse/significant other Available Help at Discharge: Family;Available 24 hours/day Type of Home: House           Additional Comments: Pt was confused today and could not give details of his home situation, could not remember details of therapy session immediately afterward    Prior Function Level of Independence: Independent         Comments: pt states he needed no AD     Hand Dominance        Extremity/Trunk Assessment   Upper Extremity Assessment Upper Extremity Assessment: Generalized weakness    Lower Extremity Assessment Lower Extremity Assessment: Generalized weakness    Cervical / Trunk Assessment Cervical / Trunk Assessment: Kyphotic  Communication   Communication: No difficulties  Cognition Arousal/Alertness: Lethargic Behavior During Therapy: Flat affect Overall Cognitive Status:  No family/caregiver present to determine baseline cognitive functioning                                 General Comments: not clear if he is at baseline      General Comments General comments (skin integrity, edema, etc.): JP drain on R side of abd with string from gown to support during standing    Exercises     Assessment/Plan    PT Assessment Patient needs continued PT services  PT Problem List Decreased  range of motion;Decreased activity tolerance;Decreased balance;Decreased mobility;Decreased coordination;Decreased safety awareness;Cardiopulmonary status limiting activity;Pain;Decreased skin integrity       PT Treatment Interventions DME instruction;Gait training;Stair training;Functional mobility training;Therapeutic activities;Therapeutic exercise;Balance training;Neuromuscular re-education;Patient/family education    PT Goals (Current goals can be found in the Care Plan section)  Acute Rehab PT Goals Patient Stated Goal: to feel better and try to walk PT Goal Formulation: With patient Time For Goal Achievement: 08/05/18 Potential to Achieve Goals: Good    Frequency Min 2X/week   Barriers to discharge Decreased caregiver support;Inaccessible home environment home with wife with 2 person assist needed    Co-evaluation               AM-PAC PT "6 Clicks" Daily Activity  Outcome Measure Difficulty turning over in bed (including adjusting bedclothes, sheets and blankets)?: A Little Difficulty moving from lying on back to sitting on the side of the bed? : Unable Difficulty sitting down on and standing up from a chair with arms (e.g., wheelchair, bedside commode, etc,.)?: Unable Help needed moving to and from a bed to chair (including a wheelchair)?: A Little Help needed walking in hospital room?: Total Help needed climbing 3-5 steps with a railing? : Total 6 Click Score: 10    End of Session Equipment Utilized During Treatment: Oxygen(removed it to stand) Activity Tolerance: Treatment limited secondary to medical complications (Comment) Patient left: in bed;with call bell/phone within reach;with bed alarm set;with nursing/sitter in room Nurse Communication: Mobility status;Other (comment)(BP values from mobility) PT Visit Diagnosis: Unsteadiness on feet (R26.81);Difficulty in walking, not elsewhere classified (R26.2);Other (comment);Pain(orthostatic hypotension) Pain - part  of body: (abdomen)    Time: 7412-8786 PT Time Calculation (min) (ACUTE ONLY): 27 min   Charges:   PT Evaluation $PT Eval Moderate Complexity: 1 Mod PT Treatments $Therapeutic Activity: 8-22 mins       Ramond Dial 07/22/2018, 1:10 PM   Mee Hives, PT MS Acute Rehab Dept. Number: [place] and [place]

## 2018-07-22 NOTE — Progress Notes (Signed)
Pt BP 138/75 after NS bolus. Pt stable and denies any dizziness or other problems at this time.

## 2018-07-22 NOTE — Progress Notes (Signed)
1 Day Post-Op robotic LAR with coloanal anastomosis and diverting loop ileostomy Subjective: Pain controlled, no nausea  Objective: Vital signs in last 24 hours: Temp:  [97.5 F (36.4 C)-98.7 F (37.1 C)] 97.6 F (36.4 C) (10/05 0911) Pulse Rate:  [77-99] 91 (10/05 0911) Resp:  [13-20] 20 (10/05 0911) BP: (100-147)/(58-99) 121/81 (10/05 0911) SpO2:  [96 %-100 %] 99 % (10/05 0911) Weight:  [89.3 kg] 89.3 kg (10/05 0557)   Intake/Output from previous day: 10/04 0701 - 10/05 0700 In: 3968.7 [P.O.:120; I.V.:3493.7; IV Piggyback:250] Out: 4163 [Urine:1075; Drains:470; Blood:300] Intake/Output this shift: Total I/O In: 0  Out: 795 [Urine:300; Drains:320; Stool:175]   General appearance: alert and cooperative GI: normal findings: soft, non-tender, non-distended JP: bloody drainage Ileostomy: pink, edematous  Incision: no significant drainage  Lab Results:  Recent Labs    07/22/18 0344  WBC 7.8  HGB 9.6*  HCT 29.5*  PLT 159   BMET Recent Labs    07/22/18 0344  NA 136  K 4.9  CL 103  CO2 14*  GLUCOSE 157*  BUN 18  CREATININE 1.07  CALCIUM 8.6*   PT/INR No results for input(s): LABPROT, INR in the last 72 hours. ABG No results for input(s): PHART, HCO3 in the last 72 hours.  Invalid input(s): PCO2, PO2  MEDS, Scheduled . acetaminophen  1,000 mg Oral Q6H  . alvimopan  12 mg Oral BID  . enoxaparin (LOVENOX) injection  40 mg Subcutaneous Q24H  . feeding supplement  237 mL Oral BID BM  . gabapentin  300 mg Oral BID  . saccharomyces boulardii  250 mg Oral BID    Studies/Results: No results found.  Assessment: s/p Procedure(s): XI ROBOTIC ASSISTED LOWER ANTERIOR RESECTION  DIVERTING LOOP ILEOOSTOMY ERAS PATHWAY RIGID PROCTOSCOPY Patient Active Problem List   Diagnosis Date Noted  . Rectal cancer ([place]) 07/21/2018    Expected post op course  Plan: Advance diet to soft foods Ambulate in hall Decrease MIV Hold lovenox for mild-miderate pelvic  bleeding    LOS: 1 day     .Rosario Adie, [facility], [place]   07/22/2018 11:02 AM

## 2018-07-23 LAB — BASIC METABOLIC PANEL
Anion gap: 7 (ref 5–15)
BUN: 14 mg/dL (ref 8–23)
CALCIUM: 8.7 mg/dL — AB (ref 8.9–10.3)
CO2: 25 mmol/L (ref 22–32)
Chloride: 103 mmol/L (ref 98–111)
Creatinine, Ser: 0.78 mg/dL (ref 0.61–1.24)
Glucose, Bld: 150 mg/dL — ABNORMAL HIGH (ref 70–99)
Potassium: 4.5 mmol/L (ref 3.5–5.1)
Sodium: 135 mmol/L (ref 135–145)

## 2018-07-23 LAB — TYPE AND SCREEN
ABO/RH(D): O POS
ABO/RH(D): O POS
ANTIBODY SCREEN: NEGATIVE
ANTIBODY SCREEN: NEGATIVE

## 2018-07-23 LAB — CBC
HEMATOCRIT: 25.4 % — AB (ref 39.0–52.0)
Hemoglobin: 8.4 g/dL — ABNORMAL LOW (ref 13.0–17.0)
MCH: 28.3 pg (ref 26.0–34.0)
MCHC: 33.1 g/dL (ref 30.0–36.0)
MCV: 85.5 fL (ref 78.0–100.0)
Platelets: 144 10*3/uL — ABNORMAL LOW (ref 150–400)
RBC: 2.97 MIL/uL — ABNORMAL LOW (ref 4.22–5.81)
RDW: 22.8 % — AB (ref 11.5–15.5)
WBC: 10.1 10*3/uL (ref 4.0–10.5)

## 2018-07-23 LAB — APTT: aPTT: 35 seconds (ref 24–36)

## 2018-07-23 LAB — PROTIME-INR
INR: 1.33
Prothrombin Time: 16.3 seconds — ABNORMAL HIGH (ref 11.4–15.2)

## 2018-07-23 NOTE — Progress Notes (Signed)
Patient struggling with hiccups.  Physician called and declined to order Thorazine. Encouraged patient to continue increasing his walking.

## 2018-07-23 NOTE — Progress Notes (Signed)
2 Days Post-Op robotic LAR with coloanal anastomosis and diverting loop ileostomy Subjective: Pain controlled, no nausea  Objective: Vital signs in last 24 hours: Temp:  [97.6 F (36.4 C)-99.2 F (37.3 C)] 99.1 F (37.3 C) (10/06 0541) Pulse Rate:  [91-112] 100 (10/06 0541) Resp:  [15-20] 16 (10/06 0541) BP: (67-139)/(41-89) 139/89 (10/06 0541) SpO2:  [99 %-100 %] 100 % (10/06 0541) Weight:  [88.5 kg] 88.5 kg (10/06 0541)   Intake/Output from previous day: 10/05 0701 - 10/06 0700 In: 2059.6 [P.O.:360; I.V.:1699.6] Out: 3210 [Urine:2275; Drains:735; Stool:200] Intake/Output this shift: Total I/O In: 120 [P.O.:120] Out: 110 [Urine:100; Drains:10]  General appearance: alert and cooperative GI: normal findings: soft, non-tender, non-distended JP: bloody drainage Ileostomy: pink, edematous  Incision: no significant drainage  Lab Results:  Recent Labs    07/22/18 0344 07/22/18 1352 07/23/18 0409  WBC 7.8  --  10.1  HGB 9.6* 8.7* 8.4*  HCT 29.5* 27.0* 25.4*  PLT 159  --  144*   BMET Recent Labs    07/22/18 0344 07/23/18 0409  NA 136 135  K 4.9 4.5  CL 103 103  CO2 14* 25  GLUCOSE 157* 150*  BUN 18 14  CREATININE 1.07 0.78  CALCIUM 8.6* 8.7*   PT/INR Recent Labs    07/23/18 0409  LABPROT 16.3*  INR 1.33   ABG No results for input(s): PHART, HCO3 in the last 72 hours.  Invalid input(s): PCO2, PO2  MEDS, Scheduled . acetaminophen  1,000 mg Oral Q6H  . alvimopan  12 mg Oral BID  . feeding supplement  237 mL Oral BID BM  . gabapentin  300 mg Oral BID  . saccharomyces boulardii  250 mg Oral BID    Studies/Results: No results found.  Assessment: s/p Procedure(s): XI ROBOTIC ASSISTED LOWER ANTERIOR RESECTION  DIVERTING LOOP ILEOOSTOMY ERAS PATHWAY RIGID PROCTOSCOPY Patient Active Problem List   Diagnosis Date Noted  . Rectal cancer ([place]) 07/21/2018    Post op ileus  Plan: soft foods as tolerated Ambulate in hall Increase MIV due to  hypotension  Hold lovenox for mild-miderate pelvic bleeding, hopefully can restart in AM    LOS: 2 days     .Rosario Adie, [facility], [place]   07/23/2018 9:11 AM

## 2018-07-24 LAB — BASIC METABOLIC PANEL
Anion gap: 8 (ref 5–15)
BUN: 13 mg/dL (ref 8–23)
CALCIUM: 8.3 mg/dL — AB (ref 8.9–10.3)
CO2: 24 mmol/L (ref 22–32)
Chloride: 100 mmol/L (ref 98–111)
Creatinine, Ser: 0.64 mg/dL (ref 0.61–1.24)
GFR calc Af Amer: >60 mL/min (ref >60)
GFR calc non Af Amer: >60 mL/min (ref >60)
Glucose, Bld: 140 mg/dL — ABNORMAL HIGH (ref 70–99)
Potassium: 4.1 mmol/L (ref 3.5–5.1)
Sodium: 132 mmol/L — ABNORMAL LOW (ref 135–145)

## 2018-07-24 LAB — CBC
HCT: 25.6 % — ABNORMAL LOW (ref 39.0–52.0)
HEMOGLOBIN: 8.3 g/dL — AB (ref 13.0–17.0)
MCH: 27.9 pg (ref 26.0–34.0)
MCHC: 32.4 g/dL (ref 30.0–36.0)
MCV: 85.9 fL (ref 78.0–100.0)
Platelets: 158 10*3/uL (ref 150–400)
RBC: 2.98 MIL/uL — AB (ref 4.22–5.81)
RDW: 21.9 % — ABNORMAL HIGH (ref 11.5–15.5)
WBC: 10.5 10*3/uL (ref 4.0–10.5)

## 2018-07-24 MED ORDER — ENOXAPARIN SODIUM 40 MG/0.4ML ~~LOC~~ SOLN
40.0000 mg | SUBCUTANEOUS | Status: DC
  Administered 2018-07-24 – 2018-07-26 (×3): 40 mg via SUBCUTANEOUS
  Filled 2018-07-24 (×3): qty 0.4

## 2018-07-24 NOTE — Addendum Note (Signed)
Addendum  created 07/24/18 0645 by Lollie Sails, CRNA   Charge Capture section accepted

## 2018-07-24 NOTE — Progress Notes (Signed)
Physical Therapy Treatment Patient Details Name: Thomas Mejia MRN: [id number] DOB: 1941-01-25 Today's Date: 07/24/2018    History of Present Illness 77 yo male with onset of rectal CA had a robotic LAR with coloanal anastomosis and diverting loop ileostomy on 07/21/18.      PT Comments    Patient progressing this session as no longer demonstrating orthostatic symptoms.  Feel he will be able to go home with intermittent help and follow up HHPT.  Will continue skilled PT until d/c.   Follow Up Recommendations  Home health PT;Supervision - Intermittent     Equipment Recommendations  Rolling walker with 5" wheels    Recommendations for Other Services       Precautions / Restrictions Precautions Precautions: Fall Restrictions Weight Bearing Restrictions: No    Mobility  Bed Mobility   Bed Mobility: Rolling Rolling: Supervision Sidelying to sit: Supervision;HOB elevated       General bed mobility comments: cues to use rail and for technique  Transfers Overall transfer level: Needs assistance Equipment used: Rolling walker (2 wheeled) Transfers: Sit to/from Stand Sit to Stand: Min guard         General transfer comment: for safety; Bp 144/93 in sitting  Ambulation/Gait Ambulation/Gait assistance: Min guard Gait Distance (Feet): 350 Feet Assistive device: Rolling walker (2 wheeled) Gait Pattern/deviations: Step-through pattern;Decreased stride length     General Gait Details: limited safety with walker use, but not really needing it for balance, cues for safety   Stairs             Wheelchair Mobility    Modified Rankin (Stroke Patients Only)       Balance     Sitting balance-Leahy Scale: Good       Standing balance-Leahy Scale: Good Standing balance comment: standing to doff and don briefs in bathroom; occasionally with 1 UE supported                            Cognition Arousal/Alertness: Awake/alert Behavior During Therapy:  WFL for tasks assessed/performed Overall Cognitive Status: Within Functional Limits for tasks assessed                                        Exercises      General Comments General comments (skin integrity, edema, etc.): changes briefs in standing and adjusted drain and colostomy bag that had just been placed by RN      Pertinent Vitals/Pain Pain Assessment: Faces Faces Pain Scale: Hurts a little bit Pain Location: post op pain Pain Descriptors / Indicators: Sore Pain Intervention(s): Monitored during session;Repositioned    Home Living                      Prior Function            PT Goals (current goals can now be found in the care plan section) Progress towards PT goals: Progressing toward goals    Frequency    Min 3X/week      PT Plan Discharge plan needs to be updated    Co-evaluation              AM-PAC PT "6 Clicks" Daily Activity  Outcome Measure  Difficulty turning over in bed (including adjusting bedclothes, sheets and blankets)?: A Little Difficulty moving from lying on back to sitting on the side  of the bed? : Unable Difficulty sitting down on and standing up from a chair with arms (e.g., wheelchair, bedside commode, etc,.)?: Unable Help needed moving to and from a bed to chair (including a wheelchair)?: A Little Help needed walking in hospital room?: A Little Help needed climbing 3-5 steps with a railing? : A Lot 6 Click Score: 13    End of Session Equipment Utilized During Treatment: Gait belt Activity Tolerance: Patient tolerated treatment well Patient left: with call bell/phone within reach;in chair   PT Visit Diagnosis: Other abnormalities of gait and mobility (R26.89);Muscle weakness (generalized) (M62.81)     Time: 1036-1100 PT Time Calculation (min) (ACUTE ONLY): 24 min  Charges:  $Gait Training: 8-22 mins $Therapeutic Activity: 8-22 mins                     Magda Kiel, PT Acute Rehabilitation  Services [phone] 07/24/2018    Reginia Naas 07/24/2018, 12:15 PM

## 2018-07-24 NOTE — Consult Note (Signed)
[place] Nurse ostomy consult note Stoma type/location: RLQ, loop ileostomy with red robinson catheter in place Stomal assessment/size:  1 3/4" pink, moist functional os at 12 o'clock  Peristomal assessment: intact  Treatment options for stomal/peristomal skin: NA Output liquid brown/yellow, seedy content as well Ostomy pouching: 1pc./2pc.  Education provided:  Explained role of ostomy nurse and creation of stoma  Explained stoma characteristics (budded, flush, color, texture, care) Demonstrated pouch change (cutting new skin barrier, measuring stoma, cleaning peristomal skin and stoma) Education on emptying when 1/3 to 1/2 full and how to empty Demonstrated use of wick to clean spout  Allowed patient to open and close spout and attach barrier to pouch.  Patient will be at home with his wife, daughter and 2 sons.  He reports his daughter will be primary support person. I will reach out to her today to determine when she might be available for teaching. Left educational materials in the room, patient was very engaged to to learn care. 2 extra pouching sets left in the room Enrolled patient in [place] program: Yes  Central Nurse will follow along with you for continued support with ostomy teaching and care [place] MSN, [place], [place], [place], [place]

## 2018-07-24 NOTE — Progress Notes (Signed)
3 Days Post-Op robotic LAR with coloanal anastomosis and diverting loop ileostomy Subjective: Pain controlled, no nausea.  Having some bloating.    Objective: Vital signs in last 24 hours: Temp:  [98.3 F (36.8 C)-98.7 F (37.1 C)] 98.4 F (36.9 C) (10/07 0602) Pulse Rate:  [97-111] 97 (10/07 0602) Resp:  [20] 20 (10/07 0602) BP: (133-150)/(84-99) 150/99 (10/07 0602) SpO2:  [99 %-100 %] 100 % (10/07 0602) Weight:  [86.9 kg] 86.9 kg (10/07 0602)   Intake/Output from previous day: 10/06 0701 - 10/07 0700 In: 1692.4 [P.O.:630; I.V.:1062.4] Out: 3329 [Urine:2650; Drains:283; JJOAC:1660] Intake/Output this shift: No intake/output data recorded.  General appearance: alert and cooperative GI: normal findings: soft, non-tender, non-distended JP: min drainage Ileostomy: pink, edematous, good output Incision: no significant drainage  Lab Results:  Recent Labs    07/23/18 0409 07/24/18 0402  WBC 10.1 10.5  HGB 8.4* 8.3*  HCT 25.4* 25.6*  PLT 144* 158   BMET Recent Labs    07/23/18 0409 07/24/18 0402  NA 135 132*  K 4.5 4.1  CL 103 100  CO2 25 24  GLUCOSE 150* 140*  BUN 14 13  CREATININE 0.78 0.64  CALCIUM 8.7* 8.3*   PT/INR Recent Labs    07/23/18 0409  LABPROT 16.3*  INR 1.33   ABG No results for input(s): PHART, HCO3 in the last 72 hours.  Invalid input(s): PCO2, PO2  MEDS, Scheduled . acetaminophen  1,000 mg Oral Q6H  . feeding supplement  237 mL Oral BID BM  . gabapentin  300 mg Oral BID  . saccharomyces boulardii  250 mg Oral BID    Studies/Results: No results found.  Assessment: s/p Procedure(s): XI ROBOTIC ASSISTED LOWER ANTERIOR RESECTION  DIVERTING LOOP ILEOOSTOMY ERAS PATHWAY RIGID PROCTOSCOPY Patient Active Problem List   Diagnosis Date Noted  . Rectal cancer ([place]) 07/21/2018    Post op ileus  Plan: soft foods as tolerated Ambulate in hall Decrease MIV  Restart lovenox  Ostomy edu today   LOS: 3 days     .Rosario Adie, MD [facility], [place]   07/24/2018 7:59 AM

## 2018-07-24 NOTE — Care Management Note (Signed)
Case Management Note  Patient Details  Name: Thomas Mejia MRN: [id number] Date of Birth: [birth year]  Subjective/Objective:    Just received a call that [facility] was prearranged for [facility] f/u prior to surgery.  They will provide [place] f/u.                Action/Plan: Notified [facility] of change.   Expected Discharge Date:                  Expected Discharge Plan:  [place]  In-House Referral:  NA  Discharge planning Services  CM Consult  Post Acute Care Choice:  Home Health, Durable Medical Equipment Choice offered to:  Patient  DME Arranged:  3-N-1 DME Agency:  [place] Arranged:  Disease Management, RN [place] Agency:  [facility]  Status of Service:  Completed, signed off  If discussed at [place] of Stay Meetings, dates discussed:    Additional Comments:  Guadalupe Maple, RN 07/24/2018, 3:21 PM

## 2018-07-24 NOTE — Progress Notes (Signed)
CSW consult-SNF  CSW met with the patient at bedside to discuss discharge plans. Patient reports he plans to return home with his spouse and wife. He reports his daughter works until 2:00pm and is home with him during the evening. Patient reports he is open to [place] services for physical therapy.  CSW informed the Case Manager.   Kathrin Greathouse, Marlinda Mike, MSW Clinical Social Worker  [phone] 07/24/2018  1:04 PM

## 2018-07-24 NOTE — Care Management Note (Signed)
Case Management Note  Patient Details  Name: Thomas Mejia MRN: [id number] Date of Birth: Dec 21, 1940  Subjective/Objective:   Spoke with patient at bedside. States he feels good about going home, has lots of family support. He has a RW at home but will need a 3n1. No preference for [facility] agency, contacted [facility] for referral.                 Action/Plan: [facility] accepted referral, they will deliver 3n1 to room prior to d/c.   Expected Discharge Date:                  Expected Discharge Plan:  [place]  In-House Referral:  NA  Discharge planning Services  CM Consult  Post Acute Care Choice:  Home Health, Durable Medical Equipment Choice offered to:  Patient  DME Arranged:  3-N-1 DME Agency:  [place] Arranged:  Disease Management, RN [place] Agency:  [place]  Status of Service:  Completed, signed off  If discussed at [place] of Stay Meetings, dates discussed:    Additional Comments:  Guadalupe Maple, RN 07/24/2018, 12:40 PM

## 2018-07-24 NOTE — Care Management Important Message (Signed)
Important Message  Patient Details  Name: Thomas Mejia MRN: [id number] Date of Birth: 26-Mar-1941   Medicare Important Message Given:  Yes    Kerin Salen 07/24/2018, 12:42 [place] Message  Patient Details  Name: Thomas Mejia MRN: [id number] Date of Birth: [birth year]   Medicare Important Message Given:  Yes    Kerin Salen 07/24/2018, 12:41 PM

## 2018-07-25 MED ORDER — CALCIUM POLYCARBOPHIL 625 MG PO TABS
625.0000 mg | ORAL_TABLET | Freq: Two times a day (BID) | ORAL | Status: DC
  Administered 2018-07-25 – 2018-07-26 (×3): 625 mg via ORAL
  Filled 2018-07-25 (×5): qty 1

## 2018-07-25 NOTE — Consult Note (Signed)
[place] nurse consulted to remove red robinson support rod.  [place] Nurse ostomy follow up Stoma type/location: RLQ, loop ileostomy Peristomal assessment: NA Treatment options for stomal/peristomal skin: NA Output liquid brown with seedy content Ostomy pouching: 2pc.2 3/4" in place, emptied pouch and removed support rod without difficulty   Education provided:  Demonstrated using wick to clean spout with patient He reports he has been up to the bathroom several times and emptied the pouch. He is able to close the new pouch with minimal cuing. I have encouraged him to make sure and drink a glass of fluid with each time he empties  Enrolled patient in [place] Discharge program: Yes [place] nurse partner will visit with patient for teaching with patient and patient's daughter tomorrow at 3pm. I have verified with the patient today for that appointment time.  [place] Nurse will follow along with you for continued support with ostomy teaching and care [place] MSN, RN, [place], [place], [place]

## 2018-07-25 NOTE — Care Management Note (Signed)
Case Management Note  Patient Details  Name: Chanz [place] MRN: [id number] Date of Birth: [birth year]  Subjective/Objective:Noted PT recc HHPT,rw. [facility] following for dme-3n1,rw to deliver to rm prior d/c. [facility] following for HHRN/HHPT-rep Tammy aware-awaiting d/c order.                    Action/Plan:dc home w/HHC/dme   Expected Discharge Date:                  Expected Discharge Plan:  [place]  In-House Referral:  NA  Discharge planning Services  CM Consult  Post Acute Care Choice:  Home Health, Durable Medical Equipment Choice offered to:  Patient  DME Arranged:  3-N-1, Walker rolling DME Agency:  [place] Arranged:  Disease Management, RN [place] Agency:  [facility]  Status of Service:  Completed, signed off  If discussed at [place] of Stay Meetings, dates discussed:    Additional Comments:  Dessa Phi, RN 07/25/2018, 10:45 AM

## 2018-07-25 NOTE — Progress Notes (Signed)
4 Days Post-Op robotic LAR with coloanal anastomosis and diverting loop ileostomy Subjective: Pain controlled, no nausea.  Having less bloating.    Objective: Vital signs in last 24 hours: Temp:  [98 F (36.7 C)-99.2 F (37.3 C)] 98.4 F (36.9 C) (10/08 0640) Pulse Rate:  [98-110] 98 (10/08 0640) Resp:  [17-22] 18 (10/08 0640) BP: (141-158)/(87-99) 155/95 (10/08 0640) SpO2:  [98 %-100 %] 100 % (10/08 0640) Weight:  [89.5 kg] 89.5 kg (10/08 0700)   Intake/Output from previous day: 10/07 0701 - 10/08 0700 In: 1896.6 [P.O.:960; I.V.:936.6] Out: 3563 [Urine:2400; Drains:3; LAGTX:6468] Intake/Output this shift: No intake/output data recorded.  General appearance: alert and cooperative GI: normal findings: soft, non-tender, non-distended JP: min drainage Ileostomy: pink, edematous, good output Incision: no significant drainage  Lab Results:  Recent Labs    07/23/18 0409 07/24/18 0402  WBC 10.1 10.5  HGB 8.4* 8.3*  HCT 25.4* 25.6*  PLT 144* 158   BMET Recent Labs    07/23/18 0409 07/24/18 0402  NA 135 132*  K 4.5 4.1  CL 103 100  CO2 25 24  GLUCOSE 150* 140*  BUN 14 13  CREATININE 0.78 0.64  CALCIUM 8.7* 8.3*   PT/INR Recent Labs    07/23/18 0409  LABPROT 16.3*  INR 1.33   ABG No results for input(s): PHART, HCO3 in the last 72 hours.  Invalid input(s): PCO2, PO2  MEDS, Scheduled . acetaminophen  1,000 mg Oral Q6H  . enoxaparin (LOVENOX) injection  40 mg Subcutaneous Q24H  . feeding supplement  237 mL Oral BID BM  . gabapentin  300 mg Oral BID  . polycarbophil  625 mg Oral BID  . saccharomyces boulardii  250 mg Oral BID    Studies/Results: No results found.  Assessment: s/p Procedure(s): XI ROBOTIC ASSISTED LOWER ANTERIOR RESECTION  DIVERTING LOOP ILEOOSTOMY ERAS PATHWAY RIGID PROCTOSCOPY Patient Active Problem List   Diagnosis Date Noted  . Rectal cancer ([place]) 07/21/2018    Post op ileus resolving  Plan: soft foods  Ambulate in  hall SL MIV  Cont lovenox  Ostomy edu today, remove ostomy bar Fiber pill to bulk stool and decrease output Pt encouraged to drink plenty of liquids today to keep up with outputs Possible d/c tom   LOS: 4 days     .Rosario Adie, MD [facility], [place]   07/25/2018 8:39 AM

## 2018-07-26 LAB — BASIC METABOLIC PANEL
ANION GAP: 10 (ref 5–15)
BUN: 18 mg/dL (ref 8–23)
CO2: 25 mmol/L (ref 22–32)
Calcium: 9.1 mg/dL (ref 8.9–10.3)
Chloride: 100 mmol/L (ref 98–111)
Creatinine, Ser: 0.8 mg/dL (ref 0.61–1.24)
GLUCOSE: 127 mg/dL — AB (ref 70–99)
POTASSIUM: 3.7 mmol/L (ref 3.5–5.1)
SODIUM: 135 mmol/L (ref 135–145)

## 2018-07-26 MED ORDER — TRAMADOL HCL 50 MG PO TABS: 50.0000 mg | ORAL_TABLET | Freq: Four times a day (QID) | ORAL | 0 refills | Status: DC | PRN

## 2018-07-26 MED ORDER — CALCIUM POLYCARBOPHIL 625 MG PO TABS: 625.0000 mg | ORAL_TABLET | Freq: Two times a day (BID) | ORAL | Status: DC

## 2018-07-26 NOTE — Consult Note (Signed)
[place] Nurse ostomy follow up Stoma type/location: RLQ loop ileostomy  Retention rod removed.  Stomal assessment/size: 1 3/4" pink patent and producing liquid green stool Peristomal assessment: intact.  Resolving blister at 2 o'clock. Covered with thin film.  Treatment options for stomal/peristomal skin: barrier ring Output liquid green stool Ostomy pouching:2pc. 2 3/4" pouch with barrier ring  Education provided: Daughter at bedside. Observes pouch change.  Patient able to independently.  Daughter cuts barrier to fit.  Discussed measuring stoma at each pouch change.  Rationale for barrier ring.  POuch applied and daughter able to roll closed.  Discussed showering, activity level and dehydration risk with ileostomy Enrolled patient in [employer] Discharge program: Yes Kit has arrived Discharging today.   Domenic Moras MSN, RN, FNP-BC CWON Wound, Ostomy, Continence Nurse Pager [phone]

## 2018-07-26 NOTE — Discharge Summary (Signed)
Physician Discharge Summary  Patient ID: Thomas Mejia MRN: [id number] DOB/AGE: [birth year] 75 y.o.  Admit date: 07/21/2018 Discharge date: 07/26/2018  Admission Diagnoses: Rectal cancer  Discharge Diagnoses:  Active Problems:   Rectal cancer [facility])   Discharged Condition: good  Hospital Course: Patient was admitted to the regular hospital floor after surgery.  Over the next couple days his diet was advanced.  He developed a small postoperative ileus but this resolved by postop day 3.  By this time he was tolerating a diet and having good bowel function.  He was given a fiber pill to help with his ileostomy output.  This allowed his output to be less than 1000 mL in 24 hours.  He did well keeping up with his fluids with this output and it was felt that he would be in stable condition for discharge to home.  Consults: None  Significant Diagnostic Studies: labs: cbc, bmet  Treatments: IV hydration, analgesia: Tramadol and surgery: robotic LAR and diverting ileostomy  Discharge Exam: Blood pressure (!) 148/98, pulse (!) 108, temperature 97.8 F (36.6 C), temperature source Oral, resp. rate 20, height 6\' 1"  (1.854 m), weight 85.5 kg, SpO2 100 %. General appearance: alert and cooperative GI: normal findings: soft, non-tender Incision/Wound: clean, dry, intact Ileostomy: pink, edemoatous  Disposition: home   Allergies as of 07/26/2018   No Known Allergies     Medication List    TAKE these medications   acetaminophen 325 MG tablet Commonly known as:  TYLENOL Take 162.5-325 mg by mouth every 6 (six) hours as needed (for pain.).   B-complex with vitamin C tablet Take 1 tablet by mouth daily.   ferrous sulfate 325 (65 FE) MG tablet Take 325 mg by mouth daily with breakfast.   Fish Oil 1000 MG Caps Take 1,000 mg by mouth daily.   HONEY PO Take 5 mLs by mouth daily. For allergies   ibuprofen 800 MG tablet Commonly known as:  ADVIL,MOTRIN Take 400 mg by mouth every 8 (eight)  hours as needed (for pain).   loratadine 10 MG tablet Commonly known as:  CLARITIN Take 10 mg by mouth daily as needed for allergies.   MAGNESIUM PO Take 1 tablet by mouth daily.   multivitamin with minerals Tabs tablet Take 1 tablet by mouth daily. Centrum Silver for Adult 27+   ODORLESS GARLIC 7412 MG Caps Generic drug:  Garlic Oil Take 8,786 mg by mouth daily.   polycarbophil 625 MG tablet Commonly known as:  FIBERCON Take 1 tablet (625 mg total) by mouth 2 (two) times daily.   tetrahydrozoline 0.05 % ophthalmic solution Place 1 drop into both eyes 3 (three) times daily as needed (for dry eyes).   traMADol 50 MG tablet Commonly known as:  ULTRAM Take 1 tablet (50 mg total) by mouth every 6 (six) hours as needed (mild pain).   vitamin B-12 1000 MCG tablet Commonly known as:  CYANOCOBALAMIN Take 1,000 mcg by mouth daily.            Durable Medical Equipment  (From admission, onward)         Start     Ordered   07/25/18 1037  For home use only DME Walker rolling  Once    Question:  77 needs a walker to treat with the following condition  Answer: 77   07/25/18 1037   07/24/18 1238  For home use only DME 3 n 1  Once     07/24/18 1238  Follow-up Information    [facility] Follow up.   Specialty:  [place] Why:  nurse to assist with ileostomy;physical therapy Contact information: [address] [place] [address] [name] Follow up.   Why:  rolling walker, bedside commode Contact information: [place] [address] [phone]        Leighton Ruff, MD. Schedule an appointment as soon as possible for a visit in 2 week(s).   Specialty:  General Surgery Contact information: [place] [address] [phone]           Signed: Rosario Adie [birth year], 75:10 PM

## 2018-07-26 NOTE — Discharge Instructions (Signed)
ABDOMINAL SURGERY: POST OP INSTRUCTIONS  1. DIET: Follow a light bland diet the first 24 hours after arrival home, such as soup, liquids, crackers, etc.  Be sure to include lots of fluids daily.  Avoid fast food or heavy meals as your are more likely to get nauseated.  Do not eat any uncooked fruits or vegetables for the next 2 weeks as your colon heals. 2. Take your usually prescribed home medications unless otherwise directed. 3. PAIN CONTROL: a. Pain is best controlled by a usual combination of three different methods TOGETHER: i. Ice/Heat ii. Over the counter pain medication iii. Prescription pain medication b. Most patients will experience some swelling and bruising around the incisions.  Ice packs or heating pads (30-60 minutes up to 6 times a day) will help. Use ice for the first few days to help decrease swelling and bruising, then switch to heat to help relax tight/sore spots and speed recovery.  Some people prefer to use ice alone, heat alone, alternating between ice & heat.  Experiment to what works for you.  Swelling and bruising can take several weeks to resolve.   c. It is helpful to take an over-the-counter pain medication regularly for the first few weeks.  Choose one of the following that works best for you: i. Naproxen (Aleve, etc)  Two 220mg  tabs twice a day ii. Ibuprofen (Advil, etc) Three 200mg  tabs four times a day (every meal & bedtime) iii. Acetaminophen (Tylenol, etc) 500-650mg  four times a day (every meal & bedtime) d. A  prescription for pain medication (such as oxycodone, hydrocodone, etc) should be given to you upon discharge.  Take your pain medication as prescribed.  i. If you are having problems/concerns with the prescription medicine (does not control pain, nausea, vomiting, rash, itching, etc), please call us [phone] to see if we need to switch you to a different pain medicine that will work better for you and/or control your side effect better. ii. If you  need a refill on your pain medication, please contact your pharmacy.  They will contact our office to request authorization. Prescriptions will not be filled after 5 pm or on week-ends. 4. Avoid getting constipated.  Between the surgery and the pain medications, it is common to experience some constipation.  Increasing fluid intake and taking a fiber supplement (such as Metamucil, Citrucel, FiberCon, MiraLax, etc) 1-2 times a day regularly will usually help prevent this problem from occurring.  A mild laxative (prune juice, Milk of Magnesia, MiraLax, etc) should be taken according to package directions if there are no bowel movements after 48 hours.   5. Watch out for diarrhea.  If you have many loose bowel movements, simplify your diet to bland foods & liquids for a few days.  Stop any stool softeners and decrease your fiber supplement.  Switching to mild anti-diarrheal medications (Kayopectate, Pepto Bismol) can help.  If this worsens or does not improve, please call us. 6. Wash / shower every day.  You may shower over the incision / wound.  Avoid baths until the skin is fully healed.  Continue to shower over incision(s) after the dressing is off. 7. MONITOR YOUR ILEOSTOMY OUTPUT DAILY.  IF YOUR OUTPUT IS >1000ML IN 24 HRS, CALL THE OFFICE.  IF YOUR URINE BECOMES DARK YELLOW OR BROWN, CALL THE OFFICE.  TAKE A FIBER PILL TWICE A DAY. 8. ACTIVITIES as tolerated:   a. You may resume regular (light) daily activities beginning the next day--such as daily self-care, walking,  climbing stairs--gradually increasing activities as tolerated.  If you can walk 30 minutes without difficulty, it is safe to try more intense activity such as jogging, treadmill, bicycling, low-impact aerobics, swimming, etc. b. Save the most intensive and strenuous activity for last such as sit-ups, heavy lifting, contact sports, etc  Refrain from any heavy lifting or straining until you are off narcotics for pain control.   c. DO NOT PUSH  THROUGH PAIN.  Let pain be your guide: If it hurts to do something, don't do it.  Pain is your body warning you to avoid that activity for another week until the pain goes down. d. You may drive when you are no longer taking prescription pain medication, you can comfortably wear a seatbelt, and you can safely maneuver your car and apply brakes. e. Dennis Bast may have sexual intercourse when it is comfortable.  9. FOLLOW UP in our office a. Please call [facility] at [phone] to set up an appointment to see your surgeon in the office for a follow-up appointment approximately 1-2 weeks after your surgery. b. Make sure that you call for this appointment the day you arrive home to insure a convenient appointment time. 10. IF YOU HAVE DISABILITY OR FAMILY LEAVE FORMS, BRING THEM TO THE OFFICE FOR PROCESSING.  DO NOT GIVE THEM TO YOUR DOCTOR.   WHEN TO CALL us [phone]: 1. Poor pain control 2. Reactions / problems with new medications (rash/itching, nausea, etc)  3. Fever over 101.5 F (38.5 C) 4. Inability to urinate 5. Nausea and/or vomiting 6. Worsening swelling or bruising 7. Continued bleeding from incision. 8. Increased pain, redness, or drainage from the incision  The clinic staff is available to answer your questions during regular business hours (8:30am-5pm).  Please dont hesitate to call and ask to speak to one of our nurses for clinical concerns.   A surgeon from [facility] is always on call at the hospitals   If you have a medical emergency, go to the nearest emergency room or call 911.    [facility], [place], [place], [place], [place]  [address] ? MAIN: [phone] ? TOLL FREE: [phone] ? FAX [phone] [email or web address]

## 2018-07-26 NOTE — Progress Notes (Signed)
Physical Therapy Treatment Patient Details Name: Thomas Mejia MRN: [id number] DOB: Dec 21, 1940 Today's Date: 07/26/2018    History of Present Illness 77 yo male with onset of rectal CA had a robotic LAR with coloanal anastomosis and diverting loop ileostomy on 07/21/18.      PT Comments    Pt ambulated in hallway with RW.  Pt's mobility continues to improve.  Pt hopeful for d/c home today.  Pt reports he has a RW at home and family to assist if needed.  Follow Up Recommendations  Home health PT;Supervision - Intermittent     Equipment Recommendations  Rolling walker with 5" wheels    Recommendations for Other Services       Precautions / Restrictions Precautions Precautions: Fall    Mobility  Bed Mobility               General bed mobility comments: pt up in recliner on arrival  Transfers Overall transfer level: Needs assistance Equipment used: None Transfers: Sit to/from Stand Sit to Stand: Supervision         General transfer comment: no difficulty or cues provided  Ambulation/Gait Ambulation/Gait assistance: Min guard;Supervision Gait Distance (Feet): 400 Feet Assistive device: Rolling walker (2 wheeled) Gait Pattern/deviations: Step-through pattern;Decreased stride length     General Gait Details: pt able to ambulate in room without RW however preferred to use for hallway distance for security/safety   Stairs             Wheelchair Mobility    Modified Rankin (Stroke Patients Only)       Balance                                            Cognition Arousal/Alertness: Awake/alert Behavior During Therapy: WFL for tasks assessed/performed Overall Cognitive Status: Within Functional Limits for tasks assessed                                        Exercises      General Comments        Pertinent Vitals/Pain Pain Assessment: Faces Faces Pain Scale: Hurts a little bit Pain Location: post op  pain Pain Descriptors / Indicators: Sore Pain Intervention(s): Limited activity within patient's tolerance;Repositioned    Home Living                      Prior Function            PT Goals (current goals can now be found in the care plan section) Progress towards PT goals: Progressing toward goals    Frequency    Min 3X/week      PT Plan Current plan remains appropriate    Co-evaluation              AM-PAC PT "6 Clicks" Daily Activity  Outcome Measure  Difficulty turning over in bed (including adjusting bedclothes, sheets and blankets)?: A Little Difficulty moving from lying on back to sitting on the side of the bed? : A Little Difficulty sitting down on and standing up from a chair with arms (e.g., wheelchair, bedside commode, etc,.)?: A Little Help needed moving to and from a bed to chair (including a wheelchair)?: A Little Help needed walking in hospital room?: A Little Help needed climbing 3-5  steps with a railing? : A Little 6 Click Score: 18    End of Session   Activity Tolerance: Patient tolerated treatment well Patient left: with call bell/phone within reach;in chair   PT Visit Diagnosis: Other abnormalities of gait and mobility (R26.89)     Time: 7092-9574 PT Time Calculation (min) (ACUTE ONLY): 14 min  Charges:  $Gait Training: 8-22 mins                     Carmelia Bake, PT, DPT Acute Rehabilitation Services Office: [phone] Pager: [phone]  Trena Platt 07/26/2018, 1:53 PM

## 2018-08-21 DIAGNOSIS — C20 Malignant neoplasm of rectum: Secondary | ICD-10-CM | POA: Diagnosis not present

## 2018-08-21 DIAGNOSIS — Z9221 Personal history of antineoplastic chemotherapy: Secondary | ICD-10-CM

## 2018-08-21 DIAGNOSIS — Z923 Personal history of irradiation: Secondary | ICD-10-CM | POA: Diagnosis not present

## 2018-08-21 DIAGNOSIS — Z9049 Acquired absence of other specified parts of digestive tract: Secondary | ICD-10-CM | POA: Diagnosis not present

## 2018-09-04 DIAGNOSIS — Z9221 Personal history of antineoplastic chemotherapy: Secondary | ICD-10-CM | POA: Diagnosis not present

## 2018-09-04 DIAGNOSIS — C20 Malignant neoplasm of rectum: Secondary | ICD-10-CM

## 2018-09-04 DIAGNOSIS — Z9049 Acquired absence of other specified parts of digestive tract: Secondary | ICD-10-CM | POA: Diagnosis not present

## 2018-09-04 DIAGNOSIS — Z923 Personal history of irradiation: Secondary | ICD-10-CM

## 2018-09-18 DIAGNOSIS — C20 Malignant neoplasm of rectum: Secondary | ICD-10-CM

## 2018-09-18 DIAGNOSIS — Z9049 Acquired absence of other specified parts of digestive tract: Secondary | ICD-10-CM

## 2018-09-18 DIAGNOSIS — Z9221 Personal history of antineoplastic chemotherapy: Secondary | ICD-10-CM

## 2018-09-18 DIAGNOSIS — D701 Agranulocytosis secondary to cancer chemotherapy: Secondary | ICD-10-CM

## 2018-09-18 DIAGNOSIS — Z923 Personal history of irradiation: Secondary | ICD-10-CM

## 2018-10-04 DIAGNOSIS — D701 Agranulocytosis secondary to cancer chemotherapy: Secondary | ICD-10-CM

## 2018-10-04 DIAGNOSIS — C20 Malignant neoplasm of rectum: Secondary | ICD-10-CM

## 2018-10-31 DIAGNOSIS — C20 Malignant neoplasm of rectum: Secondary | ICD-10-CM

## 2018-10-31 DIAGNOSIS — D701 Agranulocytosis secondary to cancer chemotherapy: Secondary | ICD-10-CM

## 2018-10-31 DIAGNOSIS — R066 Hiccough: Secondary | ICD-10-CM

## 2018-11-06 ENCOUNTER — Ambulatory Visit: Payer: Self-pay | Admitting: General Surgery

## 2018-11-06 NOTE — H&P (Signed)
History of Present Illness Leighton Ruff MD; 7/90/2409 12:12 PM) The patient is a 78 year old male who presents with colorectal cancer. 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 He underwent a colonoscopy in June 2019 which showed a distal rectal mass approximately 2-3 cm from the anal verge. Biopsy showed adenocarcinoma. CEA was elevated at 6.4. CT scans showed no signs of metastatic disease. He underwent an MRI which showed a T3 N0 mass with no externalization of the tumor outside of the mesorectal envelope. He had a 25 pound weight loss during chemotherapy. He was taken to the operating room on July 21, 2018 for a robotic-assisted low anterior resection with diverting loop ileostomy. His postoperative stay was uncomplicated. He was discharged home in stable condition. Since being home he has been tolerating a diet without difficulty. He has started adjuvant chemo. He will complete 2 more cycles and should be completed by mid Feb.   Problem List/Past Medical Leighton Ruff, MD; 7/35/3299 12:17 PM) RECTAL CANCER ([facility])  Diagnostic Studies History Leighton Ruff, MD; 2/42/6834 12:17 PM) Colonoscopy within last year  Allergies Nance Pew, [place]; 11/06/2018 12:08 PM) No Known Allergies [11/06/2018]: No Known Drug Allergies [06/13/2018]: Allergies Reconciled  Medication History Nance Pew, CMA; 11/06/2018 12:08 PM) No Current Medications Medications Reconciled  Social History Leighton Ruff, MD; 1/96/2229 12:17 PM) Caffeine use Carbonated beverages. No alcohol use No drug use Tobacco use Never smoker.  Other Problems Leighton Ruff, MD; 7/98/9211 12:17 PM) Rectal Cancer     Review of Systems Leighton Ruff MD; 9/41/7408 12:17 PM) General Present- Appetite Loss and Weight Loss. Not Present- Chills, Fatigue, Fever, Night Sweats and Weight Gain. Skin Present- Dryness. Not Present- Change in Wart/Mole, Hives,  Jaundice, New Lesions, Non-Healing Wounds, Rash and Ulcer. HEENT Present- Hoarseness, Seasonal Allergies and Sore Throat. Not Present- Earache, Hearing Loss, Nose Bleed, Oral Ulcers, Ringing in the Ears, Sinus Pain, Visual Disturbances, Wears glasses/contact lenses and Yellow Eyes. Respiratory Not Present- Bloody sputum, Chronic Cough, Difficulty Breathing, Snoring and Wheezing. Breast Not Present- Breast Mass, Breast Pain, Nipple Discharge and Skin Changes. Cardiovascular Not Present- Chest Pain, Difficulty Breathing Lying Down, Leg Cramps, Palpitations, Rapid Heart Rate, Shortness of Breath and Swelling of Extremities. Gastrointestinal Present- Change in Bowel Habits. Not Present- Abdominal Pain, Bloating, Bloody Stool, Chronic diarrhea, Constipation, Difficulty Swallowing, Excessive gas, Gets full quickly at meals, Hemorrhoids, Indigestion, Nausea, Rectal Pain and Vomiting. Male Genitourinary Not Present- Blood in Urine, Change in Urinary Stream, Frequency, Impotence, Nocturia, Painful Urination, Urgency and Urine Leakage. Musculoskeletal Not Present- Back Pain, Joint Pain, Joint Stiffness, Muscle Pain, Muscle Weakness and Swelling of Extremities. Neurological Not Present- Decreased Memory, Fainting, Headaches, Numbness, Seizures, Tingling, Tremor, Trouble walking and Weakness. Psychiatric Not Present- Anxiety, Bipolar, Change in Sleep Pattern, Depression, Fearful and Frequent crying. Endocrine Not Present- Cold Intolerance, Excessive Hunger, Hair Changes, Heat Intolerance and New Diabetes. Hematology Not Present- Blood Thinners, Easy Bruising, Excessive bleeding, Gland problems, HIV and Persistent Infections.  Vitals (Sabrina Canty CMA; 11/06/2018 12:09 PM) 11/06/2018 12:09 PM Weight: 178 lb Height: 73in Body Surface Area: 2.05 m Body Mass Index: 23.48 kg/m  Temp.: 96.68F(Temporal)  Pulse: 130 (Regular)  BP: 146/76 (Sitting, Right Arm, Standard)      Physical Exam Leighton Ruff MD; 1/44/8185 12:16 PM)  General Mental Status-Alert. General Appearance-Consistent with stated age. Hydration-Well hydrated. Voice-Normal.  Chest and Lung Exam Chest and lung exam reveals -quiet, even and easy respiratory effort with no use of accessory muscles.  Inspection Chest Wall - Normal. Back - normal.  Abdomen Inspection Skin - Scar - Note: Incision: clean, dry, and intact. Palpation/Percussion Palpation and Percussion of the abdomen reveal - Soft, Non Tender, No Rebound tenderness and No Rigidity (guarding).  Rectal Anorectal Exam External - normal external exam. Internal - normal sphincter tone. Note: no stricture.    Assessment & Plan Leighton Ruff MD; 9/43/2761 12:18 PM)  RECTAL CANCER ([facility]) Impression: 78 year old male status post low anterior resection with diverting loop ileostomy. He is completing his chemotherapy in mid February and will be ready for ileostomy reversal in mid-March. He has good rectal tone on physical exam with no sign of stricture.

## 2018-11-14 DIAGNOSIS — C20 Malignant neoplasm of rectum: Secondary | ICD-10-CM

## 2019-01-09 DIAGNOSIS — C20 Malignant neoplasm of rectum: Secondary | ICD-10-CM | POA: Diagnosis not present

## 2019-01-11 ENCOUNTER — Inpatient Hospital Stay (HOSPITAL_COMMUNITY): Admission: RE | Admit: 2019-01-11 | Payer: Medicare Other | Source: Ambulatory Visit

## 2019-01-12 ENCOUNTER — Inpatient Hospital Stay: Admit: 2019-01-12 | Payer: Medicare Other | Admitting: General Surgery

## 2019-01-12 SURGERY — CLOSURE, ILEOSTOMY
Anesthesia: General

## 2019-03-14 NOTE — Progress Notes (Signed)
Please place orders in Epic as patient has a pre-op appointment on 03/16/19! Thank you!

## 2019-03-15 ENCOUNTER — Encounter (HOSPITAL_COMMUNITY): Payer: Self-pay

## 2019-03-15 ENCOUNTER — Other Ambulatory Visit: Payer: Self-pay | Admitting: General Surgery

## 2019-03-15 NOTE — Patient Instructions (Signed)
DUE TO COVID-19 NO VISITORS ARE ALLOWED IN THE HOSPITAL AT THIS TIME   COVID SWAB TESTING MUST BE COMPLETED ON: Tuesday, March 20, 2019 (Must self quarantine after testing. Follow instructions on handout.)   Your procedure is scheduled on: Friday, March 23, 2019   Surgery Time: 11:15AM-12:45PM   Report to [facility] Main  Entrance    Report to admitting at 9:15 AM   Call this number if you have problems the morning of surgery [phone]   Do not eat food or drink liquids :After Midnight.   Brush your teeth the morning of surgery.   Do NOT smoke after Midnight   Take these medicines the morning of surgery with A SIP OF WATER: None                               You may not have any metal on your body including jewelry, and body piercings             Do not wear lotions, powders, perfumes/cologne, or deodorant                           Men may shave face and neck.   Do not bring valuables to the hospital. [place].   Contacts, dentures or bridgework may not be worn into surgery.   Bring small overnight bag day of surgery.    Special Instructions: Bring a copy of your healthcare power of attorney and living will documents         the day of surgery if you haven't scanned them in before.              Please read over the following fact sheets you were given:  [facility] - Preparing for Surgery Before surgery, you can play an important role.  Because skin is not sterile, your skin needs to be as free of germs as possible.  You can reduce the number of germs on your skin by washing with CHG (chlorahexidine gluconate) soap before surgery.  CHG is an antiseptic cleaner which kills germs and bonds with the skin to continue killing germs even after washing. Please DO NOT use if you have an allergy to CHG or antibacterial soaps.  If your skin becomes reddened/irritated stop using the CHG and inform your nurse when you arrive at  Short Stay. Do not shave (including legs and underarms) for at least 48 hours prior to the first CHG shower.  You may shave your face/neck.  Please follow these instructions carefully:  1.  Shower with CHG Soap the night before surgery and the  morning of surgery.  2.  If you choose to wash your hair, wash your hair first as usual with your normal  shampoo.  3.  After you shampoo, rinse your hair and body thoroughly to remove the shampoo.                             4.  Use CHG as you would any other liquid soap.  You can apply chg directly to the skin and wash.  Gently with a scrungie or clean washcloth.  5.  Apply the CHG Soap to your body ONLY FROM THE NECK DOWN.  Do   not use on face/ open                           Wound or open sores. Avoid contact with eyes, ears mouth and   genitals (private parts).                       Wash face,  Genitals (private parts) with your normal soap.             6.  Wash thoroughly, paying special attention to the area where your    surgery  will be performed.  7.  Thoroughly rinse your body with warm water from the neck down.  8.  DO NOT shower/wash with your normal soap after using and rinsing off the CHG Soap.                9.  Pat yourself dry with a clean towel.            10.  Wear clean pajamas.            11.  Place clean sheets on your bed the night of your first shower and do not  sleep with pets. Day of Surgery : Do not apply any lotions/deodorants the morning of surgery.  Please wear clean clothes to the hospital/surgery center.  FAILURE TO FOLLOW THESE INSTRUCTIONS MAY RESULT IN THE CANCELLATION OF YOUR SURGERY  PATIENT SIGNATURE_________________________________  NURSE SIGNATURE__________________________________  ________________________________________________________________________   Adam Phenix  An incentive spirometer is a tool that can help keep your lungs clear and active. This tool measures how well you are filling  your lungs with each breath. Taking long deep breaths may help reverse or decrease the chance of developing breathing (pulmonary) problems (especially infection) following:  A long period of time when you are unable to move or be active. BEFORE THE PROCEDURE   If the spirometer includes an indicator to show your best effort, your nurse or respiratory therapist will set it to a desired goal.  If possible, sit up straight or lean slightly forward. Try not to slouch.  Hold the incentive spirometer in an upright position. INSTRUCTIONS FOR USE  1. Sit on the edge of your bed if possible, or sit up as far as you can in bed or on a chair. 2. Hold the incentive spirometer in an upright position. 3. Breathe out normally. 4. Place the mouthpiece in your mouth and seal your lips tightly around it. 5. Breathe in slowly and as deeply as possible, raising the piston or the ball toward the top of the column. 6. Hold your breath for 3-5 seconds or for as long as possible. Allow the piston or ball to fall to the bottom of the column. 7. Remove the mouthpiece from your mouth and breathe out normally. 8. Rest for a few seconds and repeat Steps 1 through 7 at least 10 times every 1-2 hours when you are awake. Take your time and take a few normal breaths between deep breaths. 9. The spirometer may include an indicator to show your best effort. Use the indicator as a goal to work toward during each repetition. 10. After each set of 10 deep breaths, practice coughing to be sure your lungs are clear. If you have an incision (the cut made at the time of surgery), support your incision when coughing by placing a pillow or rolled up towels firmly against it. Once  you are able to get out of bed, walk around indoors and cough well. You may stop using the incentive spirometer when instructed by your caregiver.  RISKS AND COMPLICATIONS  Take your time so you do not get dizzy or light-headed.  If you are in pain, you may  need to take or ask for pain medication before doing incentive spirometry. It is harder to take a deep breath if you are having pain. AFTER USE  Rest and breathe slowly and easily.  It can be helpful to keep track of a log of your progress. Your caregiver can provide you with a simple table to help with this. If you are using the spirometer at home, follow these instructions: [place] IF:   You are having difficultly using the spirometer.  You have trouble using the spirometer as often as instructed.  Your pain medication is not giving enough relief while using the spirometer.  You develop fever of 100.5 F (38.1 C) or higher. SEEK IMMEDIATE MEDICAL CARE IF:   You cough up bloody sputum that had not been present before.  You develop fever of 102 F (38.9 C) or greater.  You develop worsening pain at or near the incision site. MAKE SURE YOU:   Understand these instructions.  Will watch your condition.  Will get help right away if you are not doing well or get worse. Document Released: 02/14/2007 Document Revised: 12/27/2011 Document Reviewed: 04/17/2007 [facility] Patient Information 2014 [place], [place].   ________________________________________________________________________

## 2019-03-15 NOTE — Progress Notes (Signed)
SPOKE W/  [place]     SCREENING SYMPTOMS OF COVID 19:   COUGH--NO  RUNNY NOSE--- NO  SORE THROAT---NO  NASAL CONGESTION----NO  SNEEZING----NO  SHORTNESS OF BREATH---NO  DIFFICULTY BREATHING---NO  TEMP >100.0 -----NO  UNEXPLAINED BODY ACHES------NO  CHILLS -------- NO  HEADACHES ---------NO  LOSS OF SMELL/ TASTE --------NO    HAVE YOU OR ANY FAMILY MEMBER TRAVELLED PAST 14 DAYS OUT OF THE   COUNTY---LIVE IN [place] STATE----NO COUNTRY----NO  HAVE YOU OR ANY FAMILY MEMBER BEEN EXPOSED TO ANYONE WITH COVID 19? NO

## 2019-03-16 ENCOUNTER — Encounter (HOSPITAL_COMMUNITY): Payer: Self-pay

## 2019-03-16 ENCOUNTER — Other Ambulatory Visit: Payer: Self-pay

## 2019-03-16 ENCOUNTER — Encounter (HOSPITAL_COMMUNITY)
Admission: RE | Admit: 2019-03-16 | Discharge: 2019-03-16 | Disposition: A | Discharge status: Home / Self Care | Payer: Medicare Other | Source: Ambulatory Visit | Attending: General Surgery | Admitting: General Surgery

## 2019-03-16 DIAGNOSIS — Z01812 Encounter for preprocedural laboratory examination: Secondary | ICD-10-CM | POA: Insufficient documentation

## 2019-03-16 DIAGNOSIS — K6389 Other specified diseases of intestine: Secondary | ICD-10-CM | POA: Diagnosis not present

## 2019-03-16 HISTORY — DX: Personal history of antineoplastic chemotherapy: Z92.21

## 2019-03-16 HISTORY — DX: Personal history of other diseases of the digestive system: Z87.19

## 2019-03-16 HISTORY — DX: Personal history of irradiation: Z92.3

## 2019-03-16 HISTORY — DX: Malignant neoplasm of rectum: C20

## 2019-03-16 LAB — BASIC METABOLIC PANEL
Anion gap: 6 (ref 5–15)
BUN: 19 mg/dL (ref 8–23)
CO2: 24 mmol/L (ref 22–32)
Calcium: 9.3 mg/dL (ref 8.9–10.3)
Chloride: 109 mmol/L (ref 98–111)
Creatinine, Ser: 1.03 mg/dL (ref 0.61–1.24)
GFR calc Af Amer: >60 mL/min (ref >60)
GFR calc non Af Amer: >60 mL/min (ref >60)
Glucose, Bld: 110 mg/dL — ABNORMAL HIGH (ref 70–99)
Potassium: 4.2 mmol/L (ref 3.5–5.1)
Sodium: 139 mmol/L (ref 135–145)

## 2019-03-16 LAB — CBC
HCT: 45.5 % (ref 39.0–52.0)
Hemoglobin: 14.4 g/dL (ref 13.0–17.0)
MCH: 29.1 pg (ref 26.0–34.0)
MCHC: 31.6 g/dL (ref 30.0–36.0)
MCV: 91.9 fL (ref 80.0–100.0)
Platelets: 145 10*3/uL — ABNORMAL LOW (ref 150–400)
RBC: 4.95 MIL/uL (ref 4.22–5.81)
RDW: 13.9 % (ref 11.5–15.5)
WBC: 3.6 10*3/uL — ABNORMAL LOW (ref 4.0–10.5)
nRBC: 0 % (ref 0.0–0.2)

## 2019-03-20 ENCOUNTER — Other Ambulatory Visit: Payer: Self-pay

## 2019-03-20 ENCOUNTER — Other Ambulatory Visit (HOSPITAL_COMMUNITY)
Admission: RE | Admit: 2019-03-20 | Discharge: 2019-03-20 | Disposition: A | Discharge status: Home / Self Care | Payer: Medicare Other | Source: Ambulatory Visit | Attending: General Surgery | Admitting: General Surgery

## 2019-03-20 DIAGNOSIS — Z1159 Encounter for screening for other viral diseases: Secondary | ICD-10-CM | POA: Diagnosis present

## 2019-03-21 LAB — NOVEL CORONAVIRUS, NAA (HOSP ORDER, SEND-OUT TO REF LAB; TAT 18-24 HRS): SARS-CoV-2, NAA: NOT DETECTED

## 2019-03-22 ENCOUNTER — Other Ambulatory Visit: Payer: Self-pay

## 2019-03-22 MED ORDER — BUPIVACAINE LIPOSOME 1.3 % IJ SUSP
20.0000 mL | INTRAMUSCULAR | Status: DC
  Filled 2019-03-22: qty 20

## 2019-03-22 NOTE — Progress Notes (Signed)
SPOKE [place] 19:   COUGH--no  RUNNY NOSE--- no  SORE THROAT---no  NASAL CONGESTION----no  SNEEZING----no  SHORTNESS OF BREATH---no  DIFFICULTY BREATHING---no  TEMP >100.0 -----no  UNEXPLAINED BODY ACHES------no  CHILLS -------- no  HEADACHES ---------no  LOSS OF SMELL/ TASTE --------no    HAVE YOU OR ANY FAMILY MEMBER TRAVELLED PAST 14 DAYS OUT OF THE   COUNTY---no STATE----no COUNTRY----no  HAVE YOU OR ANY FAMILY MEMBER BEEN EXPOSED TO ANYONE WITH COVID 19? no

## 2019-03-23 ENCOUNTER — Inpatient Hospital Stay (HOSPITAL_COMMUNITY): Payer: Medicare Other | Admitting: Anesthesiology

## 2019-03-23 ENCOUNTER — Inpatient Hospital Stay (HOSPITAL_COMMUNITY)
Admission: RE | Admit: 2019-03-23 | Discharge: 2019-03-25 | DRG: 330 | Disposition: A | Discharge status: Home / Self Care | Payer: Medicare Other | Attending: General Surgery | Admitting: General Surgery

## 2019-03-23 ENCOUNTER — Encounter (HOSPITAL_COMMUNITY)
Admission: RE | Disposition: A | Discharge status: Home / Self Care | Payer: Self-pay | Source: Home / Self Care | Attending: General Surgery

## 2019-03-23 ENCOUNTER — Inpatient Hospital Stay (HOSPITAL_COMMUNITY): Payer: Medicare Other | Admitting: Physician Assistant

## 2019-03-23 ENCOUNTER — Encounter (HOSPITAL_COMMUNITY): Payer: Self-pay | Admitting: *Deleted

## 2019-03-23 DIAGNOSIS — Z932 Ileostomy status: Secondary | ICD-10-CM

## 2019-03-23 DIAGNOSIS — Z432 Encounter for attention to ileostomy: Principal | ICD-10-CM

## 2019-03-23 DIAGNOSIS — Z9221 Personal history of antineoplastic chemotherapy: Secondary | ICD-10-CM

## 2019-03-23 DIAGNOSIS — K660 Peritoneal adhesions (postprocedural) (postinfection): Secondary | ICD-10-CM | POA: Diagnosis present

## 2019-03-23 DIAGNOSIS — C20 Malignant neoplasm of rectum: Secondary | ICD-10-CM | POA: Diagnosis present

## 2019-03-23 HISTORY — PX: ILEOSTOMY CLOSURE: SHX1784

## 2019-03-23 SURGERY — CLOSURE, ILEOSTOMY
Anesthesia: General | Site: Abdomen

## 2019-03-23 MED ORDER — GABAPENTIN 300 MG PO CAPS
300.0000 mg | ORAL_CAPSULE | Freq: Two times a day (BID) | ORAL | Status: DC
  Administered 2019-03-23 – 2019-03-25 (×5): 300 mg via ORAL
  Filled 2019-03-23 (×5): qty 1

## 2019-03-23 MED ORDER — LACTATED RINGERS IV SOLN
INTRAVENOUS | Status: DC
  Administered 2019-03-23 (×2): via INTRAVENOUS

## 2019-03-23 MED ORDER — DEXAMETHASONE SODIUM PHOSPHATE 10 MG/ML IJ SOLN
INTRAMUSCULAR | Status: AC
  Filled 2019-03-23: qty 1

## 2019-03-23 MED ORDER — FENTANYL CITRATE (PF) 250 MCG/5ML IJ SOLN
INTRAMUSCULAR | Status: DC | PRN
  Administered 2019-03-23: 100 ug via INTRAVENOUS
  Administered 2019-03-23: 50 ug via INTRAVENOUS

## 2019-03-23 MED ORDER — BUPIVACAINE-EPINEPHRINE (PF) 0.25% -1:200000 IJ SOLN
INTRAMUSCULAR | Status: AC
  Filled 2019-03-23: qty 30

## 2019-03-23 MED ORDER — DEXAMETHASONE SODIUM PHOSPHATE 10 MG/ML IJ SOLN
INTRAMUSCULAR | Status: DC | PRN
  Administered 2019-03-23: 10 mg via INTRAVENOUS

## 2019-03-23 MED ORDER — ACETAMINOPHEN 500 MG PO TABS
1000.0000 mg | ORAL_TABLET | Freq: Four times a day (QID) | ORAL | Status: DC
  Administered 2019-03-23 – 2019-03-25 (×7): 1000 mg via ORAL
  Filled 2019-03-23 (×7): qty 2

## 2019-03-23 MED ORDER — ENOXAPARIN SODIUM 40 MG/0.4ML ~~LOC~~ SOLN
40.0000 mg | SUBCUTANEOUS | Status: DC
  Administered 2019-03-24 – 2019-03-25 (×2): 40 mg via SUBCUTANEOUS
  Filled 2019-03-23 (×2): qty 0.4

## 2019-03-23 MED ORDER — LIDOCAINE 2% (20 MG/ML) 5 ML SYRINGE
INTRAMUSCULAR | Status: DC | PRN
  Administered 2019-03-23: 100 mg via INTRAVENOUS

## 2019-03-23 MED ORDER — SUGAMMADEX SODIUM 200 MG/2ML IV SOLN
INTRAVENOUS | Status: DC | PRN
  Administered 2019-03-23: 200 mg via INTRAVENOUS

## 2019-03-23 MED ORDER — NAPHAZOLINE-GLYCERIN 0.012-0.2 % OP SOLN
1.0000 [drp] | Freq: Four times a day (QID) | OPHTHALMIC | Status: DC | PRN
  Filled 2019-03-23: qty 15

## 2019-03-23 MED ORDER — LACTATED RINGERS IV SOLN: INTRAVENOUS | Status: DC

## 2019-03-23 MED ORDER — ROCURONIUM BROMIDE 10 MG/ML (PF) SYRINGE
PREFILLED_SYRINGE | INTRAVENOUS | Status: AC
  Filled 2019-03-23: qty 10

## 2019-03-23 MED ORDER — ALUM & MAG HYDROXIDE-SIMETH 200-200-20 MG/5ML PO SUSP: 30.0000 mL | Freq: Four times a day (QID) | ORAL | Status: DC | PRN

## 2019-03-23 MED ORDER — TRAMADOL HCL 50 MG PO TABS
50.0000 mg | ORAL_TABLET | Freq: Four times a day (QID) | ORAL | Status: DC | PRN
  Administered 2019-03-24: 50 mg via ORAL
  Filled 2019-03-23 (×2): qty 1

## 2019-03-23 MED ORDER — KETAMINE HCL 10 MG/ML IJ SOLN
INTRAMUSCULAR | Status: DC | PRN
  Administered 2019-03-23: 40 mg via INTRAVENOUS

## 2019-03-23 MED ORDER — SUCCINYLCHOLINE CHLORIDE 200 MG/10ML IV SOSY
PREFILLED_SYRINGE | INTRAVENOUS | Status: AC
  Filled 2019-03-23: qty 10

## 2019-03-23 MED ORDER — ROCURONIUM BROMIDE 10 MG/ML (PF) SYRINGE
PREFILLED_SYRINGE | INTRAVENOUS | Status: DC | PRN
  Administered 2019-03-23: 30 mg via INTRAVENOUS
  Administered 2019-03-23: 10 mg via INTRAVENOUS

## 2019-03-23 MED ORDER — ONDANSETRON HCL 4 MG/2ML IJ SOLN
INTRAMUSCULAR | Status: AC
  Filled 2019-03-23: qty 2

## 2019-03-23 MED ORDER — ONDANSETRON HCL 4 MG/2ML IJ SOLN: 4.0000 mg | Freq: Four times a day (QID) | INTRAMUSCULAR | Status: DC | PRN

## 2019-03-23 MED ORDER — SACCHAROMYCES BOULARDII 250 MG PO CAPS
250.0000 mg | ORAL_CAPSULE | Freq: Two times a day (BID) | ORAL | Status: DC
  Administered 2019-03-23 – 2019-03-25 (×5): 250 mg via ORAL
  Filled 2019-03-23 (×5): qty 1

## 2019-03-23 MED ORDER — LIDOCAINE 2% (20 MG/ML) 5 ML SYRINGE
INTRAMUSCULAR | Status: AC
  Filled 2019-03-23: qty 5

## 2019-03-23 MED ORDER — PROPOFOL 10 MG/ML IV BOLUS
INTRAVENOUS | Status: DC | PRN
  Administered 2019-03-23: 140 mg via INTRAVENOUS

## 2019-03-23 MED ORDER — ALVIMOPAN 12 MG PO CAPS
12.0000 mg | ORAL_CAPSULE | ORAL | Status: AC
  Administered 2019-03-23: 10:00:00 12 mg via ORAL
  Filled 2019-03-23: qty 1

## 2019-03-23 MED ORDER — PROPOFOL 10 MG/ML IV BOLUS
INTRAVENOUS | Status: AC
  Filled 2019-03-23: qty 20

## 2019-03-23 MED ORDER — LIDOCAINE 2% (20 MG/ML) 5 ML SYRINGE
INTRAMUSCULAR | Status: DC | PRN
  Administered 2019-03-23: 1 mg/kg/h via INTRAVENOUS

## 2019-03-23 MED ORDER — DIPHENHYDRAMINE HCL 50 MG/ML IJ SOLN: 12.5000 mg | Freq: Four times a day (QID) | INTRAMUSCULAR | Status: DC | PRN

## 2019-03-23 MED ORDER — FENTANYL CITRATE (PF) 250 MCG/5ML IJ SOLN
INTRAMUSCULAR | Status: AC
  Filled 2019-03-23: qty 5

## 2019-03-23 MED ORDER — BUPIVACAINE-EPINEPHRINE (PF) 0.25% -1:200000 IJ SOLN
INTRAMUSCULAR | Status: DC | PRN
  Administered 2019-03-23: 30 mL

## 2019-03-23 MED ORDER — BUPIVACAINE LIPOSOME 1.3 % IJ SUSP
INTRAMUSCULAR | Status: DC | PRN
  Administered 2019-03-23: 20 mL

## 2019-03-23 MED ORDER — FENTANYL CITRATE (PF) 100 MCG/2ML IJ SOLN
INTRAMUSCULAR | Status: AC
  Filled 2019-03-23: qty 2

## 2019-03-23 MED ORDER — KCL IN DEXTROSE-NACL 20-5-0.45 MEQ/L-%-% IV SOLN
INTRAVENOUS | Status: DC
  Administered 2019-03-23: 14:00:00 via INTRAVENOUS
  Filled 2019-03-23 (×2): qty 1000

## 2019-03-23 MED ORDER — GABAPENTIN 300 MG PO CAPS
300.0000 mg | ORAL_CAPSULE | ORAL | Status: AC
  Administered 2019-03-23: 300 mg via ORAL
  Filled 2019-03-23: qty 1

## 2019-03-23 MED ORDER — HYDROMORPHONE HCL 1 MG/ML IJ SOLN: 0.5000 mg | INTRAMUSCULAR | Status: DC | PRN

## 2019-03-23 MED ORDER — ONDANSETRON HCL 4 MG/2ML IJ SOLN: 4.0000 mg | Freq: Once | INTRAMUSCULAR | Status: DC | PRN

## 2019-03-23 MED ORDER — 0.9 % SODIUM CHLORIDE (POUR BTL) OPTIME
TOPICAL | Status: DC | PRN
  Administered 2019-03-23: 2000 mL

## 2019-03-23 MED ORDER — LABETALOL HCL 5 MG/ML IV SOLN
INTRAVENOUS | Status: DC | PRN
  Administered 2019-03-23 (×2): 5 mg via INTRAVENOUS

## 2019-03-23 MED ORDER — SUCCINYLCHOLINE CHLORIDE 200 MG/10ML IV SOSY
PREFILLED_SYRINGE | INTRAVENOUS | Status: DC | PRN
  Administered 2019-03-23: 120 mg via INTRAVENOUS

## 2019-03-23 MED ORDER — SODIUM CHLORIDE 0.9 % IV SOLN
2.0000 g | INTRAVENOUS | Status: AC
  Administered 2019-03-23: 2 g via INTRAVENOUS
  Filled 2019-03-23: qty 2

## 2019-03-23 MED ORDER — ONDANSETRON HCL 4 MG/2ML IJ SOLN
INTRAMUSCULAR | Status: DC | PRN
  Administered 2019-03-23: 4 mg via INTRAVENOUS

## 2019-03-23 MED ORDER — ONDANSETRON HCL 4 MG PO TABS: 4.0000 mg | ORAL_TABLET | Freq: Four times a day (QID) | ORAL | Status: DC | PRN

## 2019-03-23 MED ORDER — SUGAMMADEX SODIUM 200 MG/2ML IV SOLN
INTRAVENOUS | Status: AC
  Filled 2019-03-23: qty 2

## 2019-03-23 MED ORDER — FENTANYL CITRATE (PF) 100 MCG/2ML IJ SOLN
25.0000 ug | INTRAMUSCULAR | Status: DC | PRN
  Administered 2019-03-23 (×2): 50 ug via INTRAVENOUS

## 2019-03-23 MED ORDER — ACETAMINOPHEN 500 MG PO TABS
1000.0000 mg | ORAL_TABLET | ORAL | Status: AC
  Administered 2019-03-23: 1000 mg via ORAL
  Filled 2019-03-23: qty 2

## 2019-03-23 MED ORDER — ENSURE SURGERY PO LIQD
237.0000 mL | Freq: Two times a day (BID) | ORAL | Status: DC
  Administered 2019-03-23 – 2019-03-25 (×4): 237 mL via ORAL
  Filled 2019-03-23 (×5): qty 237

## 2019-03-23 MED ORDER — ALVIMOPAN 12 MG PO CAPS
12.0000 mg | ORAL_CAPSULE | Freq: Two times a day (BID) | ORAL | Status: DC
  Administered 2019-03-24: 22:00:00 12 mg via ORAL
  Filled 2019-03-23 (×3): qty 1

## 2019-03-23 MED ORDER — DIPHENHYDRAMINE HCL 12.5 MG/5ML PO ELIX: 12.5000 mg | ORAL_SOLUTION | Freq: Four times a day (QID) | ORAL | Status: DC | PRN

## 2019-03-23 SURGICAL SUPPLY — 54 items
BLADE HEX COATED 2.75 (ELECTRODE) ×3 IMPLANT
CHLORAPREP W/TINT 26 (MISCELLANEOUS) ×3 IMPLANT
COVER MAYO STAND STRL (DRAPES) ×3 IMPLANT
COVER WAND RF STERILE (DRAPES) IMPLANT
DRAPE LAPAROSCOPIC ABDOMINAL (DRAPES) ×3 IMPLANT
DRAPE UTILITY XL STRL (DRAPES) IMPLANT
DRAPE WARM FLUID 44X44 (DRAPES) ×3 IMPLANT
DRSG OPSITE POSTOP 4X10 (GAUZE/BANDAGES/DRESSINGS) IMPLANT
DRSG OPSITE POSTOP 4X6 (GAUZE/BANDAGES/DRESSINGS) IMPLANT
DRSG OPSITE POSTOP 4X8 (GAUZE/BANDAGES/DRESSINGS) IMPLANT
DRSG TELFA 3X8 NADH (GAUZE/BANDAGES/DRESSINGS) IMPLANT
ELECT REM PT RETURN 15FT ADLT (MISCELLANEOUS) ×3 IMPLANT
GAUZE SPONGE 4X4 12PLY STRL (GAUZE/BANDAGES/DRESSINGS) ×3 IMPLANT
GLOVE BIO SURGEON STRL SZ 6.5 (GLOVE) ×4 IMPLANT
GLOVE BIO SURGEONS STRL SZ 6.5 (GLOVE) ×2
GLOVE BIOGEL PI IND STRL 6.5 (GLOVE) IMPLANT
GLOVE BIOGEL PI IND STRL 7.0 (GLOVE) ×1 IMPLANT
GLOVE BIOGEL PI IND STRL 7.5 (GLOVE) IMPLANT
GLOVE BIOGEL PI INDICATOR 6.5 (GLOVE) ×2
GLOVE BIOGEL PI INDICATOR 7.0 (GLOVE) ×2
GLOVE BIOGEL PI INDICATOR 7.5 (GLOVE) ×4
GLOVE ECLIPSE 6.5 STRL STRAW (GLOVE) ×2 IMPLANT
GLOVE SURG SS PI 7.0 STRL IVOR (GLOVE) ×2 IMPLANT
GOWN STRL REUS W/TWL 2XL LVL3 (GOWN DISPOSABLE) ×3 IMPLANT
GOWN STRL REUS W/TWL XL LVL3 (GOWN DISPOSABLE) ×5 IMPLANT
HANDLE SUCTION POOLE (INSTRUMENTS) ×1 IMPLANT
HOLDER FOLEY CATH W/STRAP (MISCELLANEOUS) IMPLANT
KIT BASIN OR (CUSTOM PROCEDURE TRAY) ×3 IMPLANT
KIT TURNOVER KIT A (KITS) ×2 IMPLANT
MANIFOLD NEPTUNE II (INSTRUMENTS) ×3 IMPLANT
PACK GENERAL/GYN (CUSTOM PROCEDURE TRAY) ×3 IMPLANT
PAD DRESSING TELFA 3X8 NADH (GAUZE/BANDAGES/DRESSINGS) IMPLANT
RELOAD PROXIMATE 75MM BLUE (ENDOMECHANICALS) ×6 IMPLANT
RELOAD STAPLE 75 3.8 BLU REG (ENDOMECHANICALS) IMPLANT
SPONGE LAP 18X18 RF (DISPOSABLE) IMPLANT
STAPLER GUN LINEAR PROX 60 (STAPLE) ×2 IMPLANT
STAPLER PROXIMATE 75MM BLUE (STAPLE) ×2 IMPLANT
SUCTION POOLE HANDLE (INSTRUMENTS) ×3
SUT NOVA NAB DX-16 0-1 5-0 T12 (SUTURE) ×4 IMPLANT
SUT NOVA NAB GS-21 0 18 T12 DT (SUTURE) ×2 IMPLANT
SUT PROLENE 2 0 BLUE (SUTURE) IMPLANT
SUT SILK 2 0 (SUTURE) ×2
SUT SILK 2 0 SH CR/8 (SUTURE) ×3 IMPLANT
SUT SILK 2-0 18XBRD TIE 12 (SUTURE) ×1 IMPLANT
SUT SILK 3 0 (SUTURE) ×2
SUT SILK 3 0 SH CR/8 (SUTURE) ×3 IMPLANT
SUT SILK 3-0 18XBRD TIE 12 (SUTURE) ×1 IMPLANT
SUT VIC AB 2-0 SH 18 (SUTURE) IMPLANT
SUT VIC AB 2-0 SH 27 (SUTURE) ×2
SUT VIC AB 2-0 SH 27X BRD (SUTURE) ×2 IMPLANT
SUT VIC AB 4-0 PS2 18 (SUTURE) ×3 IMPLANT
TOWEL OR 17X26 10 PK STRL BLUE (TOWEL DISPOSABLE) ×6 IMPLANT
TOWEL OR NON WOVEN STRL DISP B (DISPOSABLE) ×6 IMPLANT
YANKAUER SUCT BULB TIP NO VENT (SUCTIONS) ×3 IMPLANT

## 2019-03-23 NOTE — Op Note (Signed)
03/23/2019  11:42 AM  PATIENT:  Thomas Mejia  78 y.o. male  Patient Care Team: Lillard Anes, MD as PCP - General (Family Medicine)  PRE-OPERATIVE DIAGNOSIS:  rectal cancer, diverting ileostomy  POST-OPERATIVE DIAGNOSIS:  DIVERTING ILEOSTOMY  PROCEDURE: ILEOSTOMY TAKEDOWN   Surgeon(s): Leighton Ruff, MD  ASSISTANT: none   ANESTHESIA:   local and General  EBL: 76ml  DRAINS: none   SPECIMEN:  Source of Specimen:  ileostomy  DISPOSITION OF SPECIMEN:  PATHOLOGY  COUNTS:  YES  PLAN OF CARE: Admit to inpatient   PATIENT DISPOSITION:  PACU - hemodynamically stable.  INDICATION: 78 y.o. M with diverting ileostomy after LAR   OR FINDINGS: Ileostomy with moderate, dense adhesions  DESCRIPTION: the patient was identified in the preoperative holding area and taken to the OR where they were laid supine on the operating room table.  General anesthesia was induced without difficulty. SCDs were also noted to be in place prior to the initiation of anesthesia.  The patient was then prepped and draped in the usual sterile fashion.   A surgical timeout was performed indicating the correct patient, procedure, positioning and need for preoperative antibiotics.   I began by making an incision around the mucocutaneous junction of the ileostomy.  Dissection was carried down through the subcutaneous tissue using blunt dissection and electrocautery.  I was able to enter into the abdomen between the ileostomy and the fascia.  I continued to sweep the rather dense adhesions from both left the lateral sidewall and the medial area.  Once I can safely delineate adhesions from bowel, these were divided using combination of sharp dissection and electrocautery.  There was a loop of small intestines that was adherent to the ileostomy.  This was carefully dissected free and placed back into the abdomen.  I then transected the ileostomy using a 75 mm blue load stapler x2.  An anastomosis was then  created also using the 75 mm blue load stapler.  A 60 mm TA stapler was used to close the common enterotomy channel.  The anastomosis was imbricated using interrupted 3-0 silk sutures.  An anti-tension suture was placed in the crotch of the anastomosis as well.  This was then placed back into the abdomen.  The fascia was then closed using interrupted #1 Novafil sutures.  The subcutaneous tissue was reapproximated using a pursestring 2-0 Vicryl suture.  The dermal layer was also closed partially using a 2-0 Vicryl pursestring suture.  A sterile Telfa wick was placed on the middle and a dressing was placed over top of this.  The patient was then awakened from anesthesia and sent to the postanesthesia care unit in stable condition.  All counts were correct per operating room staff.

## 2019-03-23 NOTE — Anesthesia Preprocedure Evaluation (Signed)
Anesthesia Evaluation  Patient identified by MRN, date of birth, ID band Patient awake    Reviewed: Allergy & Precautions, NPO status , Patient's Chart, lab work & pertinent test results  History of Anesthesia Complications Negative for: history of anesthetic complications  Airway Mallampati: III  TM Distance: >3 FB Neck ROM: Full    Dental  (+) Dental Advisory Given, Missing   Pulmonary neg pulmonary ROS,    Pulmonary exam normal breath sounds clear to auscultation       Cardiovascular Exercise Tolerance: Good negative cardio ROS Normal cardiovascular exam Rhythm:Regular Rate:Normal     Neuro/Psych negative neurological ROS  negative psych ROS   GI/Hepatic Neg liver ROS, Rectal cancer s/p ileostomy, chemo/radation    Endo/Other  negative endocrine ROS  Renal/GU negative Renal ROS     Musculoskeletal negative musculoskeletal ROS (+)   Abdominal   Peds  Hematology  (+) Blood dyscrasia (Thrombocytopenia), ,   Anesthesia Other Findings Day of surgery medications reviewed with the patient.  Reproductive/Obstetrics                             Anesthesia Physical Anesthesia Plan  ASA: III  Anesthesia Plan: General   Post-op Pain Management:    Induction: Intravenous  PONV Risk Score and Plan: 3 and Midazolam, Dexamethasone and Ondansetron  Airway Management Planned: Oral ETT  Additional Equipment:   Intra-op Plan:   Post-operative Plan: Extubation in OR  Informed Consent: I have reviewed the patients History and Physical, chart, labs and discussed the procedure including the risks, benefits and alternatives for the proposed anesthesia with the patient or authorized representative who has indicated his/her understanding and acceptance.     Dental advisory given  Plan Discussed with: CRNA  Anesthesia Plan Comments: (Risks/benefits of general anesthesia discussed with  patient including risk of damage to teeth, lips, gum, and tongue, nausea/vomiting, allergic reactions to medications, and the possibility of heart attack, stroke and death.  All patient questions answered.  Patient wishes to proceed.)        Anesthesia Quick Evaluation

## 2019-03-23 NOTE — Discharge Instructions (Signed)
ABDOMINAL SURGERY: POST OP INSTRUCTIONS  1. DIET: Follow a light bland diet the first 24 hours after arrival home, such as soup, liquids, crackers, etc.  Be sure to include lots of fluids daily.  Avoid fast food or heavy meals as your are more likely to get nauseated.  Do not eat any uncooked fruits or vegetables for the next 2 weeks as your colon heals. 2. Take your usually prescribed home medications unless otherwise directed. 3. PAIN CONTROL: a. Pain is best controlled by a usual combination of three different methods TOGETHER: i. Ice/Heat ii. Over the counter pain medication iii. Prescription pain medication b. Most patients will experience some swelling and bruising around the incisions.  Ice packs or heating pads (30-60 minutes up to 6 times a day) will help. Use ice for the first few days to help decrease swelling and bruising, then switch to heat to help relax tight/sore spots and speed recovery.  Some people prefer to use ice alone, heat alone, alternating between ice & heat.  Experiment to what works for you.  Swelling and bruising can take several weeks to resolve.   c. It is helpful to take an over-the-counter pain medication regularly for the first few weeks.  Choose one of the following that works best for you: i. Naproxen (Aleve, etc)  Two 220mg  tabs twice a day ii. Ibuprofen (Advil, etc) Three 200mg  tabs four times a day (every meal & bedtime) iii. Acetaminophen (Tylenol, etc) 500-650mg  four times a day (every meal & bedtime) d. A  prescription for pain medication (such as oxycodone, hydrocodone, etc) should be given to you upon discharge.  Take your pain medication as prescribed.  i. If you are having problems/concerns with the prescription medicine (does not control pain, nausea, vomiting, rash, itching, etc), please call us [phone] to see if we need to switch you to a different pain medicine that will work better for you and/or control your side effect better. ii. If you  need a refill on your pain medication, please contact your pharmacy.  They will contact our office to request authorization. Prescriptions will not be filled after 5 pm or on week-ends. 4. Avoid getting constipated.  Between the surgery and the pain medications, it is common to experience some constipation.  Increasing fluid intake and taking a fiber supplement (such as Metamucil, Citrucel, FiberCon, MiraLax, etc) 1-2 times a day regularly will usually help prevent this problem from occurring.  A mild laxative (prune juice, Milk of Magnesia, MiraLax, etc) should be taken according to package directions if there are no bowel movements after 48 hours.   5. Watch out for diarrhea.  If you have many loose bowel movements, simplify your diet to bland foods & liquids for a few days.  Stop any stool softeners and decrease your fiber supplement.  Switching to mild anti-diarrheal medications (Kayopectate, Pepto Bismol) can help.  If this worsens or does not improve, please call us. 6. Wash / shower every day.  You may shower over the incision / wound. Cover with a dry dressing afterwards.   7. Change your dressing at least once a day 8. ACTIVITIES as tolerated:   a. You may resume regular (light) daily activities beginning the next day--such as daily self-care, walking, climbing stairs--gradually increasing activities as tolerated.  If you can walk 30 minutes without difficulty, it is safe to try more intense activity such as jogging, treadmill, bicycling, low-impact aerobics, swimming, etc. b. Save the most intensive and strenuous activity for  last such as sit-ups, heavy lifting, contact sports, etc  Refrain from any heavy lifting or straining until you are off narcotics for pain control.   c. DO NOT PUSH THROUGH PAIN.  Let pain be your guide: If it hurts to do something, don't do it.  Pain is your body warning you to avoid that activity for another week until the pain goes down. d. You may drive when you are no  longer taking prescription pain medication, you can comfortably wear a seatbelt, and you can safely maneuver your car and apply brakes. e. Dennis Bast may have sexual intercourse when it is comfortable.  9. FOLLOW UP in our office a. Please call [facility] at [phone] to set up an appointment to see your surgeon in the office for a follow-up appointment approximately 1-2 weeks after your surgery. b. Make sure that you call for this appointment the day you arrive home to insure a convenient appointment time. 10. IF YOU HAVE DISABILITY OR FAMILY LEAVE FORMS, BRING THEM TO THE OFFICE FOR PROCESSING.  DO NOT GIVE THEM TO YOUR DOCTOR.   WHEN TO CALL us [phone]: 1. Poor pain control 2. Reactions / problems with new medications (rash/itching, nausea, etc)  3. Fever over 101.5 F (38.5 C) 4. Inability to urinate 5. Nausea and/or vomiting 6. Worsening swelling or bruising 7. Continued bleeding from incision. 8. Increased pain, redness, or drainage from the incision  The clinic staff is available to answer your questions during regular business hours (8:30am-5pm).  Please dont hesitate to call and ask to speak to one of our nurses for clinical concerns.   A surgeon from [facility] is always on call at the hospitals   If you have a medical emergency, go to the nearest emergency room or call 911.    [facility], [place], [address], [place], [place]  [address] ? MAIN: [phone] ? TOLL FREE: [phone] ? FAX [phone] [email or web address]  Tips for getting used to your new bowel patterns.  Diet Since many people have frequent bowel movements right after the operation, following a diet can help reduce the number of stools. Incorporate new foods into your diet one at a time to see how they affect your stool output. You may find that some foods give you trouble initially, but that you can tolerate them better later.  Do not skip meals.  This tends to make the stools more irritating and loose.  Balancing starches with foods that tend to give diarrhea is also helpful in controlling the frequency of bowel movements.  Hot and spicy foods will probably burn on the way out and should be avoided if your anal area feels irritated.  Seeds and nuts also can be irritating.  Within three to nine months after surgery, your body will have started to adjust. At this point, you should try to eat all types of foods and see how they affect you. Some patients have tried a low glycemic index diet, which includes foods that are high in fiber and cause a slow rise in blood sugar, to help control their bowel movements. Resources on the Internet and at your [employer] are available for more information on this type of diet.  Fluids are important to prevent dehydration. Drink enough fluid so your urine is light yellow in color. Avoid fruit juices, carbonated beverages, drinks with caffeine and straws (swallowed air increases gas). Rather than drinking fluids with your meal, try drinking fluids at  the end of your meal, which may help slow down your stool.  Many people ask about vitamin supplements after their operation when they are limiting fruits and vegetables. It is fine to take vitamins, but they should be chewable or in liquid form, otherwise they may not be fully absorbed.  Stool Frequency Multiple bowel movements a day are expected. As the colon adapts and stretches to its normal capacity, the number of stools you have per day should decrease. You will probably have many stools while you are in the hospital, but these should lessen by the time you go home. Often the first stools are without your control while in the hospital, though this is resolved before you go home. The biggest increase in stools usually occurs after you begin eating. You can expect your number of bowel movements to decrease gradually. The stools will range from watery to a  paste-like consistency. By watching your diet, you can avoid foods that tend to produce loose stools. The more frequent your bowel movements, the more itching and burning you will have around your anus. A combined approach of diet and medications, such as Imodium and Lomotil, may help decrease your number of bowel movements. Most people need to take these medications more frequently right after having their ileoanal reservoir procedure, but only use them occasionally as their reservoir function improves. For some people, adding a fiber supplement, such as Metamucil, Benefiber, Konsyl, Citracel or a generic equivalent, and taking them with half the recommended amount of water, can help thicken their stool and reduce the number of bowel movements. Nighttime Stool Frequency Stool frequency at night is a common problem and can be very disruptive of a good night's sleep. More than half of patients surveyed -- about 63 percent -- get up once or twice per night to pass stool, and about 24 percent of patients awaken three times or more during the night to have a bowel movement. This can be related to eating late, overeating or eating foods known to cause problems. Tips for decreasing the number of stools at night include: Don't eat late. Wait at least three to four hours after your last meal before going to bed.  Take an anti-diarrheal medication before going to bed.  Eat binding foods at dinner and avoid those foods that tend to cause diarrhea.  Don't overeat at dinner. Diarrhea Diarrhea occurs when your stool is very watery and more frequent than usual. Diarrhea can be caused by eating certain types of foods, eating too much of any food, pouchitis and other illnesses like the flu. If you develop the flu early in your recovery and have diarrhea, you may become dehydrated and need to be hospitalized. Sometimes people have diarrhea because they don't have the enzyme called lactase needed to digest the sugar in  milk products. If you feel bloated and have cramps and gas after drinking milk, try not drinking it and see if symptoms go away. You can also try fermented milk products, such as yogurt, hard cheese and buttermilk, as well as soy or goat's milk. Adding fiber supplements, such as Metamucil, Benefiber, Konsyl, Citracel or the generic equivalent, can help thicken your stool and reduce diarrhea. Gatorade or other sport drinks can also be helpful in treating diarrhea by keeping you hydrated. Skin Care Until your body adjusts to your new internal pouch, you may experience some leakage of stool, especially at night. Liquid stool is very irritating to the skin around the anus, so it is important  to keep this area clean and dry. Use soft, white, non-perfumed toilet tissue to blot gently after each bowel movement, drying the skin completely. Do not scratch, rub or wipe the skin. It may help to spray water from a squirt bottle. Some people find drying the skin with a blow dryer helps. For irritated skin around the anus, warm baths are very soothing. Do not apply anything to the skin that burns or irritates. Moist cotton balls may be better for wiping if your skin is very sore. Some people use baby wipes to cleanse themselves. Use a brand without alcohol or scents. Some people tuck a small piece of toilet tissue or a dry cotton ball near the anal opening to protect their skin from small amounts of leakage. Scented soaps and tissues should not be used since these may cause irritation. A protective ointment may be used after each bowel movement to keep the stool off the skin. You may want to wear a protective pad to keep your clothing clean. Anal itching and burning are often not visible on the outside because the irritated area is actually on the inside. You can look with a mirror to see if your irritation is on the outside skin. Some of the itching and burning is a normal part of the internal healing and will go away in  time. Some people have found Pepto Bismol taken by mouth is helpful for the burning. Sometimes people develop a yeast infection around the anal area. It happens more commonly when people are taking antibiotics, or in women around the time of their period. If you get an itchy red rash, you may need an anti-fungal cream or ointment. This is available over the counter. Occasionally people are allergic to the preparations they are using around their anus. This is not very common. Some products contain lanolin -- if you are allergic to this, check the ingredients of ointments before using. Incontinence Mild incontinence or leakage of stool from your J-pouch is a common problem that improves with time as your stool thickens, your pouch stretches and your sphincters become stronger. The incontinence is usually worse at night when your sphincters relax. The looser the stool, the more likely it is to leak. Some people accidentally pass stool when they are passing gas. In time, most people develop an awareness of whether they are passing stool or gas, but initially this can be a problem. It is also important to note that if you develop incontinence later on, you may have pouchitis. According to our survey, 60 percent of people occasionally pass stool without control, usually at night while in a deep sleep. About 8 percent occasionally pass some liquid stool during the day. About 35 percent notice staining of stool on their underwear at least once a month during the day or the night. Often this can be related to eating something that causes a problem, having pouchitis, overeating, going to bed soon after eating, or just being in a very deep sleep. Medications, diet and careful skin care are all important during this period of adaptation. A small pad helps absorb leakage. Some people find sleeping on a small towel or pad also is helpful. Before your operation, it is a good idea to buy more cotton underwear.

## 2019-03-23 NOTE — Anesthesia Procedure Notes (Signed)
Procedure Name: Intubation Date/Time: 03/23/2019 10:22 AM Performed by: Sharlette Dense, CRNA Patient Re-evaluated:Patient Re-evaluated prior to induction Oxygen Delivery Method: Circle system utilized Preoxygenation: Pre-oxygenation with 100% oxygen Induction Type: IV induction and Rapid sequence Laryngoscope Size: Miller and 3 Grade View: Grade II Tube type: Oral Tube size: 8.0 mm Number of attempts: 1 Airway Equipment and Method: Stylet Placement Confirmation: ETT inserted through vocal cords under direct vision,  positive ETCO2 and breath sounds checked- equal and bilateral Secured at: 22 cm Tube secured with: Tape Dental Injury: Teeth and Oropharynx as per pre-operative assessment

## 2019-03-23 NOTE — H&P (Signed)
The patient is a 78 year old male who presents with colorectal cancer. 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 He underwent a colonoscopy in June 2019 which showed a distal rectal mass approximately 2-3 cm from the anal verge. Biopsy showed adenocarcinoma. CEA was elevated at 6.4. CT scans showed no signs of metastatic disease. He underwent an MRI which showed a T3 N0 mass with no externalization of the tumor outside of the mesorectal envelope. He had a 25 pound weight loss during chemotherapy. He was taken to the operating room on July 21, 2018 for a robotic-assisted low anterior resection with diverting loop ileostomy. His postoperative stay was uncomplicated. He was discharged home in stable condition. Since being home he has been tolerating a diet without difficulty. He completed chemo mid Feb.   Problem List/Past Medical Leighton Ruff, MD; 05/28/9146 12:17 PM) RECTAL CANCER (C20)  Diagnostic Studies History Leighton Ruff, MD; 06/15/5620 12:17 PM) Colonoscopy within last year  Allergies Nance Pew, [place]; 11/06/2018 12:08 PM) No Known Allergies [11/06/2018]: No Known Drug Allergies [06/13/2018]: Allergies Reconciled  Medication History Nance Pew, CMA; 11/06/2018 12:08 PM) No Current Medications Medications Reconciled  Social History Leighton Ruff, MD; 12/23/6576 12:17 PM) Caffeine use Carbonated beverages. No alcohol use No drug use Tobacco use Never smoker.  Other Problems Leighton Ruff, MD; 4/69/6295 12:17 PM) Rectal Cancer     Review of Systems General Present- Appetite Loss and Weight Loss. Not Present- Chills, Fatigue, Fever, Night Sweats and Weight Gain. Skin Present- Dryness. Not Present- Change in Wart/Mole, Hives, Jaundice, New Lesions, Non-Healing Wounds, Rash and Ulcer. HEENT Present- Hoarseness, Seasonal Allergies and Sore Throat. Not Present- Earache, Hearing Loss, Nose Bleed,  Oral Ulcers, Ringing in the Ears, Sinus Pain, Visual Disturbances, Wears glasses/contact lenses and Yellow Eyes. Respiratory Not Present- Bloody sputum, Chronic Cough, Difficulty Breathing, Snoring and Wheezing. Breast Not Present- Breast Mass, Breast Pain, Nipple Discharge and Skin Changes. Cardiovascular Not Present- Chest Pain, Difficulty Breathing Lying Down, Leg Cramps, Palpitations, Rapid Heart Rate, Shortness of Breath and Swelling of Extremities. Gastrointestinal Present- Change in Bowel Habits. Not Present- Abdominal Pain, Bloating, Bloody Stool, Chronic diarrhea, Constipation, Difficulty Swallowing, Excessive gas, Gets full quickly at meals, Hemorrhoids, Indigestion, Nausea, Rectal Pain and Vomiting. Male Genitourinary Not Present- Blood in Urine, Change in Urinary Stream, Frequency, Impotence, Nocturia, Painful Urination, Urgency and Urine Leakage. Musculoskeletal Not Present- Back Pain, Joint Pain, Joint Stiffness, Muscle Pain, Muscle Weakness and Swelling of Extremities. Neurological Not Present- Decreased Memory, Fainting, Headaches, Numbness, Seizures, Tingling, Tremor, Trouble walking and Weakness. Psychiatric Not Present- Anxiety, Bipolar, Change in Sleep Pattern, Depression, Fearful and Frequent crying. Endocrine Not Present- Cold Intolerance, Excessive Hunger, Hair Changes, Heat Intolerance and New Diabetes. Hematology Not Present- Blood Thinners, Easy Bruising, Excessive bleeding, Gland problems, HIV and Persistent Infections.  BP (!) 146/101   Pulse 98   Temp 97.8 F (36.6 C) (Oral)   Resp 16   Ht 6\' 1"  (1.854 m)   Wt 95 kg   SpO2 100%   BMI 27.64 kg/m    Physical Exam   General Mental Status-Alert. General Appearance-Consistent with stated age. Hydration-Well hydrated. Voice-Normal.  Chest and Lung Exam Chest and lung exam reveals -quiet, even and easy respiratory effort with no use of accessory muscles. Inspection Chest Wall - Normal. Back -  normal.  Abdomen Inspection Skin - Scar - Note: Incision: clean, dry, and intact. Palpation/Percussion Palpation and Percussion of the abdomen reveal - Soft, Non Tender, No Rebound  tenderness and No Rigidity (guarding).  Rectal Anorectal Exam External - normal external exam. Internal - normal sphincter tone. Note: no stricture.    Assessment & Plan   RECTAL CANCER (C20) Impression: 78 year old male status post low anterior resection with diverting loop ileostomy. He is completed his chemotherapy in mid February and is ready for ileostomy reversal. He has good rectal tone on physical exam with no sign of stricture.  Risks include bleeding, leak, infection and bowel obstruction.  I believe he understands this and agrees to proceed.

## 2019-03-23 NOTE — Anesthesia Postprocedure Evaluation (Signed)
Anesthesia Post Note  Patient: Miller Limehouse  Procedure(s) Performed: ILEOSTOMY REVERSAL ERAS PATHWAY (N/A Abdomen)     Patient location during evaluation: PACU Anesthesia Type: General Level of consciousness: awake and alert and awake Pain management: pain level controlled Vital Signs Assessment: post-procedure vital signs reviewed and stable Respiratory status: spontaneous breathing, nonlabored ventilation, respiratory function stable and patient connected to nasal cannula oxygen Cardiovascular status: blood pressure returned to baseline and stable Postop Assessment: no apparent nausea or vomiting Anesthetic complications: no    Last Vitals:  Vitals:   03/23/19 1534 03/23/19 1630  BP: (!) 146/93 (!) 164/91  Pulse: 74 83  Resp: 17 16  Temp: 36.4 C (!) 36.4 C  SpO2: 100% 100%    Last Pain:  Vitals:   03/23/19 1630  TempSrc: Oral  PainSc:                  Catalina Gravel

## 2019-03-23 NOTE — Transfer of Care (Signed)
Immediate Anesthesia Transfer of Care Note  Patient: Jewel Venditto  Procedure(s) Performed: ILEOSTOMY REVERSAL ERAS PATHWAY (N/A Abdomen)  Patient Location: PACU  Anesthesia Type:General  Level of Consciousness: drowsy  Airway & Oxygen Therapy: Patient Spontanous Breathing and Patient connected to face mask oxygen  Post-op Assessment: Report given to RN and Post -op Vital signs reviewed and stable  Post vital signs: Reviewed and stable  Last Vitals:  Vitals Value Taken Time  BP    Temp    Pulse 80 03/23/2019 11:38 AM  Resp 22 03/23/2019 11:38 AM  SpO2 100 % 03/23/2019 11:38 AM  Vitals shown include unvalidated device data.  Last Pain:  Vitals:   03/23/19 0916  TempSrc: Oral         Complications: No apparent anesthesia complications

## 2019-03-24 ENCOUNTER — Encounter (HOSPITAL_COMMUNITY): Payer: Self-pay | Admitting: General Surgery

## 2019-03-24 LAB — CBC
HCT: 41 % (ref 39.0–52.0)
Hemoglobin: 13.2 g/dL (ref 13.0–17.0)
MCH: 29.5 pg (ref 26.0–34.0)
MCHC: 32.2 g/dL (ref 30.0–36.0)
MCV: 91.5 fL (ref 80.0–100.0)
Platelets: 142 10*3/uL — ABNORMAL LOW (ref 150–400)
RBC: 4.48 MIL/uL (ref 4.22–5.81)
RDW: 13.7 % (ref 11.5–15.5)
WBC: 7.6 10*3/uL (ref 4.0–10.5)
nRBC: 0 % (ref 0.0–0.2)

## 2019-03-24 LAB — BASIC METABOLIC PANEL
Anion gap: 6 (ref 5–15)
BUN: 16 mg/dL (ref 8–23)
CO2: 23 mmol/L (ref 22–32)
Calcium: 8.9 mg/dL (ref 8.9–10.3)
Chloride: 106 mmol/L (ref 98–111)
Creatinine, Ser: 0.78 mg/dL (ref 0.61–1.24)
GFR calc Af Amer: >60 mL/min (ref >60)
GFR calc non Af Amer: >60 mL/min (ref >60)
Glucose, Bld: 140 mg/dL — ABNORMAL HIGH (ref 70–99)
Potassium: 4.3 mmol/L (ref 3.5–5.1)
Sodium: 135 mmol/L (ref 135–145)

## 2019-03-24 NOTE — Progress Notes (Signed)
1 Day Post-Op   Subjective/Chief Complaint: Patient having bowel function Ambulating Minimal pain   Objective: Vital signs in last 24 hours: Temp:  [97.5 F (36.4 C)-97.9 F (36.6 C)] 97.9 F (36.6 C) (06/06 0606) Pulse Rate:  [71-98] 76 (06/06 0606) Resp:  [13-22] 16 (06/06 0606) BP: (123-164)/(80-101) 145/88 (06/06 0606) SpO2:  [95 %-100 %] 100 % (06/06 0606) Weight:  [95 kg-96.1 kg] 96.1 kg (06/06 0606) Last BM Date: 03/23/19  Intake/Output from previous day: 06/05 0701 - 06/06 0700 In: 2803.7 [P.O.:720; I.V.:1983.7; IV Piggyback:100] Out: 2325 [Urine:2275; Blood:50] Intake/Output this shift: No intake/output data recorded.  General appearance: alert and cooperative GI: soft, non-tender; bowel sounds normal; no masses,  no organomegaly and Incision clean dry and intact  Lab Results:  Recent Labs    03/24/19 0328  WBC 7.6  HGB 13.2  HCT 41.0  PLT 142*   BMET Recent Labs    03/24/19 0328  NA 135  K 4.3  CL 106  CO2 23  GLUCOSE 140*  BUN 16  CREATININE 0.78  CALCIUM 8.9  Anti-infectives: Anti-infectives (From admission, onward)   Start     Dose/Rate Route Frequency Ordered Stop   03/23/19 0930  cefoTEtan (CEFOTAN) 2 g in sodium chloride 0.9 % 100 mL IVPB     2 g 200 mL/hr over 30 Minutes Intravenous On call to O.R. 03/23/19 0920 03/23/19 1035      Assessment/Plan: s/p Procedure(s): ILEOSTOMY REVERSAL ERAS PATHWAY (N/A) Advance diet  Mobilize Hopefully home in the next 1 to 2 days.  LOS: 1 day    Ralene Ok 03/24/2019

## 2019-03-24 NOTE — Plan of Care (Signed)
Patient ambulating in room this morning; no complaints or concerns noted. Pain controlled. Will continue to monitor.

## 2019-03-25 LAB — BASIC METABOLIC PANEL
Anion gap: 7 (ref 5–15)
BUN: 14 mg/dL (ref 8–23)
CO2: 23 mmol/L (ref 22–32)
Calcium: 8.7 mg/dL — ABNORMAL LOW (ref 8.9–10.3)
Chloride: 106 mmol/L (ref 98–111)
Creatinine, Ser: 0.79 mg/dL (ref 0.61–1.24)
GFR calc Af Amer: >60 mL/min (ref >60)
GFR calc non Af Amer: >60 mL/min (ref >60)
Glucose, Bld: 121 mg/dL — ABNORMAL HIGH (ref 70–99)
Potassium: 3.9 mmol/L (ref 3.5–5.1)
Sodium: 136 mmol/L (ref 135–145)

## 2019-03-25 LAB — CBC
HCT: 42.5 % (ref 39.0–52.0)
Hemoglobin: 13.6 g/dL (ref 13.0–17.0)
MCH: 29.5 pg (ref 26.0–34.0)
MCHC: 32 g/dL (ref 30.0–36.0)
MCV: 92.2 fL (ref 80.0–100.0)
Platelets: 145 10*3/uL — ABNORMAL LOW (ref 150–400)
RBC: 4.61 MIL/uL (ref 4.22–5.81)
RDW: 13.9 % (ref 11.5–15.5)
WBC: 6.3 10*3/uL (ref 4.0–10.5)
nRBC: 0 % (ref 0.0–0.2)

## 2019-03-25 MED ORDER — TRAMADOL HCL 50 MG PO TABS: 50.0000 mg | ORAL_TABLET | Freq: Four times a day (QID) | ORAL | 0 refills | Status: AC | PRN

## 2019-03-25 NOTE — Plan of Care (Signed)
Patient up in room this morning. No complaints of pain or other concerns voiced. Will continue to monitor.

## 2019-03-25 NOTE — Plan of Care (Signed)
Reviewed discharge instructions with patient; copy given. IV removed. Patient ready for discharge.  

## 2019-03-25 NOTE — Discharge Summary (Signed)
Physician Discharge Summary  Patient ID: Thomas Mejia MRN: [id number] DOB/AGE: Aug 12, 1941 78 y.o.  Admit date: 03/23/2019 Discharge date: 03/25/2019  Admission Diagnoses:s/P ileostomy reversal  Discharge Diagnoses:  Active Problems:   Ileostomy present [facility])   Discharged Condition: good  Hospital Course: Pt was admitted post op on ERAS protocol.  Pt did well post op and adv from a clear diet to a reg diet.  He was ambulating well on his own.  He was having good bowel function.  He was afebrile deemed stabel for DC and [place] home.  Consults: none  Significant Diagnostic Studies: none  Treatments: surgery: as above  Discharge Exam: Blood pressure (!) 157/98, pulse 85, temperature 98.7 F (37.1 C), temperature source Oral, resp. rate 16, height 6\' 1"  (1.854 m), weight 96.4 kg, SpO2 98 %. General appearance: alert and cooperative GI: soft, non-tender; bowel sounds normal; no masses,  no organomegaly and in c/d/i  Disposition: Discharge disposition: 01-Home or Self Care       Discharge Instructions    Diet - low sodium heart healthy   Complete by:  As directed    Increase activity slowly   Complete by:  As directed      Allergies as of 03/25/2019   No Known Allergies     Medication List    TAKE these medications   Fish Oil 1000 MG Caps Take 1,000 mg by mouth daily.   HONEY PO Take 5 mLs by mouth daily.   ibuprofen 800 MG tablet Commonly known as:  ADVIL Take 400 mg by mouth every 8 (eight) hours as needed (for pain).   MAGNESIUM PO Take 250 mg by mouth daily.   multivitamin with minerals Tabs tablet Take 1 tablet by mouth daily. Centrum Silver for Adult 96+   Odorless Garlic 7591 MG Caps Generic drug:  Garlic Oil Take 6,384 mg by mouth daily.   tetrahydrozoline 0.05 % ophthalmic solution Place 1 drop into both eyes 3 (three) times daily as needed (for dry eyes).   traMADol 50 MG tablet Commonly known as:  ULTRAM Take 1 tablet (50 mg total) by mouth  every 6 (six) hours as needed (mild pain).   vitamin B-12 1000 MCG tablet Commonly known as:  CYANOCOBALAMIN Take 1,000 mcg by mouth daily.      Follow-up Information    Leighton Ruff, MD. Schedule an appointment as soon as possible for a visit in 2 weeks.   Specialty:  General Surgery Contact information: [address] [place] [address] [phone]           Signed: Ralene Ok 03/25/2019, 9:26 AM

## 2019-03-30 ENCOUNTER — Encounter (HOSPITAL_COMMUNITY): Payer: Self-pay | Admitting: Emergency Medicine

## 2019-03-30 ENCOUNTER — Inpatient Hospital Stay (HOSPITAL_COMMUNITY)
Admission: EM | Admit: 2019-03-30 | Discharge: 2019-04-18 | DRG: 371 | Disposition: E | Payer: Medicare Other | Attending: Surgery | Admitting: Surgery

## 2019-03-30 ENCOUNTER — Other Ambulatory Visit: Payer: Self-pay

## 2019-03-30 DIAGNOSIS — E875 Hyperkalemia: Secondary | ICD-10-CM | POA: Diagnosis not present

## 2019-03-30 DIAGNOSIS — I469 Cardiac arrest, cause unspecified: Secondary | ICD-10-CM | POA: Diagnosis not present

## 2019-03-30 DIAGNOSIS — Z0189 Encounter for other specified special examinations: Secondary | ICD-10-CM

## 2019-03-30 DIAGNOSIS — Z923 Personal history of irradiation: Secondary | ICD-10-CM

## 2019-03-30 DIAGNOSIS — K922 Gastrointestinal hemorrhage, unspecified: Secondary | ICD-10-CM | POA: Diagnosis present

## 2019-03-30 DIAGNOSIS — E872 Acidosis, unspecified: Secondary | ICD-10-CM

## 2019-03-30 DIAGNOSIS — I452 Bifascicular block: Secondary | ICD-10-CM | POA: Diagnosis present

## 2019-03-30 DIAGNOSIS — N179 Acute kidney failure, unspecified: Secondary | ICD-10-CM | POA: Diagnosis not present

## 2019-03-30 DIAGNOSIS — E861 Hypovolemia: Secondary | ICD-10-CM | POA: Diagnosis not present

## 2019-03-30 DIAGNOSIS — A0472 Enterocolitis due to Clostridium difficile, not specified as recurrent: Secondary | ICD-10-CM | POA: Diagnosis not present

## 2019-03-30 DIAGNOSIS — Z85038 Personal history of other malignant neoplasm of large intestine: Secondary | ICD-10-CM

## 2019-03-30 DIAGNOSIS — E8729 Other acidosis: Secondary | ICD-10-CM

## 2019-03-30 DIAGNOSIS — Z9889 Other specified postprocedural states: Secondary | ICD-10-CM

## 2019-03-30 DIAGNOSIS — E876 Hypokalemia: Secondary | ICD-10-CM | POA: Diagnosis not present

## 2019-03-30 DIAGNOSIS — Z4659 Encounter for fitting and adjustment of other gastrointestinal appliance and device: Secondary | ICD-10-CM

## 2019-03-30 DIAGNOSIS — G9341 Metabolic encephalopathy: Secondary | ICD-10-CM | POA: Diagnosis not present

## 2019-03-30 DIAGNOSIS — A419 Sepsis, unspecified organism: Secondary | ICD-10-CM | POA: Diagnosis not present

## 2019-03-30 DIAGNOSIS — Z9221 Personal history of antineoplastic chemotherapy: Secondary | ICD-10-CM

## 2019-03-30 DIAGNOSIS — Z20828 Contact with and (suspected) exposure to other viral communicable diseases: Secondary | ICD-10-CM | POA: Diagnosis present

## 2019-03-30 DIAGNOSIS — K625 Hemorrhage of anus and rectum: Secondary | ICD-10-CM | POA: Diagnosis not present

## 2019-03-30 DIAGNOSIS — Z66 Do not resuscitate: Secondary | ICD-10-CM | POA: Diagnosis not present

## 2019-03-30 DIAGNOSIS — R6521 Severe sepsis with septic shock: Secondary | ICD-10-CM | POA: Diagnosis not present

## 2019-03-30 LAB — COMPREHENSIVE METABOLIC PANEL
ALT: 26 U/L (ref 0–44)
AST: 26 U/L (ref 15–41)
Albumin: 3.8 g/dL (ref 3.5–5.0)
Alkaline Phosphatase: 75 U/L (ref 38–126)
Anion gap: 9 (ref 5–15)
BUN: 21 mg/dL (ref 8–23)
CO2: 24 mmol/L (ref 22–32)
Calcium: 9 mg/dL (ref 8.9–10.3)
Chloride: 106 mmol/L (ref 98–111)
Creatinine, Ser: 0.91 mg/dL (ref 0.61–1.24)
GFR calc Af Amer: >60 mL/min (ref >60)
GFR calc non Af Amer: >60 mL/min (ref >60)
Glucose, Bld: 123 mg/dL — ABNORMAL HIGH (ref 70–99)
Potassium: 2.8 mmol/L — ABNORMAL LOW (ref 3.5–5.1)
Sodium: 139 mmol/L (ref 135–145)
Total Bilirubin: 0.9 mg/dL (ref 0.3–1.2)
Total Protein: 7.7 g/dL (ref 6.5–8.1)

## 2019-03-30 LAB — CBC
HCT: 44.6 % (ref 39.0–52.0)
Hemoglobin: 14.4 g/dL (ref 13.0–17.0)
MCH: 28.7 pg (ref 26.0–34.0)
MCHC: 32.3 g/dL (ref 30.0–36.0)
MCV: 88.8 fL (ref 80.0–100.0)
Platelets: 167 10*3/uL (ref 150–400)
RBC: 5.02 MIL/uL (ref 4.22–5.81)
RDW: 13.8 % (ref 11.5–15.5)
WBC: 6.5 10*3/uL (ref 4.0–10.5)
nRBC: 0 % (ref 0.0–0.2)

## 2019-03-30 LAB — TYPE AND SCREEN
ABO/RH(D): O POS
Antibody Screen: NEGATIVE

## 2019-03-30 LAB — PROTIME-INR
INR: 1.1 (ref 0.8–1.2)
Prothrombin Time: 14.5 seconds (ref 11.4–15.2)

## 2019-03-30 MED ORDER — SIMETHICONE 80 MG PO CHEW
40.0000 mg | CHEWABLE_TABLET | Freq: Four times a day (QID) | ORAL | Status: DC | PRN
  Filled 2019-03-30: qty 1

## 2019-03-30 MED ORDER — METHOCARBAMOL 500 MG PO TABS
500.0000 mg | ORAL_TABLET | Freq: Four times a day (QID) | ORAL | Status: DC | PRN
  Administered 2019-03-31: 500 mg via ORAL
  Filled 2019-03-30: qty 1

## 2019-03-30 MED ORDER — MORPHINE SULFATE (PF) 2 MG/ML IV SOLN
1.0000 mg | INTRAVENOUS | Status: DC | PRN
  Administered 2019-04-01: 1 mg via INTRAVENOUS
  Filled 2019-03-30: qty 1

## 2019-03-30 MED ORDER — ACETAMINOPHEN 650 MG RE SUPP: 650.0000 mg | Freq: Four times a day (QID) | RECTAL | Status: DC | PRN

## 2019-03-30 MED ORDER — POLYETHYLENE GLYCOL 3350 17 G PO PACK: 17.0000 g | PACK | Freq: Every day | ORAL | Status: DC | PRN

## 2019-03-30 MED ORDER — HYDRALAZINE HCL 20 MG/ML IJ SOLN: 10.0000 mg | INTRAMUSCULAR | Status: DC | PRN

## 2019-03-30 MED ORDER — DIPHENHYDRAMINE HCL 50 MG/ML IJ SOLN: 12.5000 mg | Freq: Four times a day (QID) | INTRAMUSCULAR | Status: DC | PRN

## 2019-03-30 MED ORDER — TRAMADOL HCL 50 MG PO TABS
50.0000 mg | ORAL_TABLET | Freq: Four times a day (QID) | ORAL | Status: DC | PRN
  Administered 2019-03-31 – 2019-04-01 (×2): 50 mg via ORAL
  Filled 2019-03-30 (×2): qty 1

## 2019-03-30 MED ORDER — ACETAMINOPHEN 325 MG PO TABS
650.0000 mg | ORAL_TABLET | Freq: Four times a day (QID) | ORAL | Status: DC | PRN
  Administered 2019-03-30 – 2019-03-31 (×3): 650 mg via ORAL
  Filled 2019-03-30 (×3): qty 2

## 2019-03-30 MED ORDER — POTASSIUM CHLORIDE CRYS ER 20 MEQ PO TBCR
40.0000 meq | EXTENDED_RELEASE_TABLET | Freq: Two times a day (BID) | ORAL | Status: AC
  Administered 2019-03-30 – 2019-03-31 (×4): 40 meq via ORAL
  Filled 2019-03-30 (×4): qty 2

## 2019-03-30 MED ORDER — ONDANSETRON HCL 4 MG/2ML IJ SOLN
4.0000 mg | Freq: Four times a day (QID) | INTRAMUSCULAR | Status: DC | PRN
  Administered 2019-03-30: 4 mg via INTRAVENOUS
  Filled 2019-03-30: qty 2

## 2019-03-30 MED ORDER — ONDANSETRON 4 MG PO TBDP: 4.0000 mg | ORAL_TABLET | Freq: Four times a day (QID) | ORAL | Status: DC | PRN

## 2019-03-30 MED ORDER — POTASSIUM CHLORIDE IN NACL 40-0.9 MEQ/L-% IV SOLN
INTRAVENOUS | Status: DC
  Administered 2019-03-30 – 2019-03-31 (×2): 100 mL/h via INTRAVENOUS
  Filled 2019-03-30 (×4): qty 1000

## 2019-03-30 MED ORDER — PANTOPRAZOLE SODIUM 40 MG IV SOLR
40.0000 mg | Freq: Every day | INTRAVENOUS | Status: DC
  Administered 2019-03-30 – 2019-03-31 (×2): 40 mg via INTRAVENOUS
  Filled 2019-03-30 (×2): qty 40

## 2019-03-30 MED ORDER — DIPHENHYDRAMINE HCL 12.5 MG/5ML PO ELIX: 12.5000 mg | ORAL_SOLUTION | Freq: Four times a day (QID) | ORAL | Status: DC | PRN

## 2019-03-30 NOTE — ED Provider Notes (Signed)
[facility] Emergency Department Provider Note MRN:  [id number]  Arrival date & time: 03/29/2019     Chief Complaint   Rectal Bleeding and Post-op Problem   History of Present Illness   Thomas Mejia is a 78 y.o. year-old male with a history of colon cancer presenting to the ED with chief complaint of rectal bleeding.  Patient had a partial bowel resection late last year with formation of colostomy.  Colostomy was reversed a few days ago by his Psychologist, sport and exercise.  Has been recovering well.  Loose stools post op but surgeon told him this would happen.  Small amount of blood in stool yesterday.  Woke up this morning and his briefs were filled with moderate to large volume of bloody stool, red blood, mixed within the stool.  Continued blood in stool this morning with BMs.  Denies any other symptoms, no dizziness, no abdominal pain, no chest pain, SOB, cough, fever.  Review of Systems  A complete 10 system review of systems was obtained and all systems are negative except as noted in the HPI and PMH.   Patient's Health History    Past Medical History:  Diagnosis Date  . History of chemotherapy   . History of inguinal hernia   . History of radiation therapy   . Rectal cancer (HCC)    7.2 x 5.6 x 7.9 cm right lateral mid rectal mass    Past Surgical History:  Procedure Laterality Date  . HERNIA REPAIR  03/05/2016  . ILEOSTOMY CLOSURE N/A 03/23/2019   Procedure: ILEOSTOMY REVERSAL ERAS PATHWAY;  Surgeon: Leighton Ruff, MD;  Location: [facility];  Service: General;  Laterality: N/A;  . port a cath insertion    . PROCTOSCOPY  07/21/2018   Procedure: RIGID PROCTOSCOPY;  Surgeon: Leighton Ruff, MD;  Location: [facility];  Service: General;;  . XI ROBOTIC ASSISTED LOWER ANTERIOR RESECTION N/A 07/21/2018   Procedure: XI ROBOTIC ASSISTED LOWER ANTERIOR RESECTION  DIVERTING LOOP ILEOOSTOMY ERAS PATHWAY;  Surgeon: Leighton Ruff, MD;  Location: [facility];  Service: General;  Laterality: N/A;    No  family history on file.  Social History   Socioeconomic History  . Marital status: Married    Spouse name: Not on file  . Number of children: Not on file  . Years of education: Not on file  . Highest education level: Not on file  Occupational History  . Not on file  Social Needs  . Financial resource strain: Not on file  . Food insecurity    Worry: Not on file    Inability: Not on file  . Transportation needs    Medical: Not on file    Non-medical: Not on file  Tobacco Use  . Smoking status: Never Smoker  . Smokeless tobacco: Never Used  Substance and Sexual Activity  . Alcohol use: Never    Frequency: Never  . Drug use: Never  . Sexual activity: Not on file  Lifestyle  . Physical activity    Days per week: Not on file    Minutes per session: Not on file  . Stress: Not on file  Relationships  . Social Herbalist on phone: Not on file    Gets together: Not on file    Attends religious service: Not on file    Active member of club or organization: Not on file    Attends meetings of clubs or organizations: Not on file    Relationship status: Not on file  .  Intimate partner violence    Fear of current or ex partner: Not on file    Emotionally abused: Not on file    Physically abused: Not on file    Forced sexual activity: Not on file  Other Topics Concern  . Not on file  Social History Narrative  . Not on file     Physical Exam  Vital Signs and Nursing Notes reviewed Vitals:   04/04/2019 1007 03/26/2019 1100  BP: (!) 140/102 (!) 152/93  Pulse: (!) 111 93  Resp: 16 18  Temp:    SpO2: 98% 97%    CONSTITUTIONAL:  well-appearing, NAD NEURO:  Alert and oriented x 3, no focal deficits EYES:  eyes equal and reactive ENT/NECK:  no LAD, no JVD CARDIO:  regular rate, well-perfused, normal S1 and S2 PULM:  CTAB no wheezing or rhonchi GI/GU:  normal bowel sounds, non-distended, non-tender; well healing would to the RLQ MSK/SPINE:  No gross deformities, no  edema SKIN:  no rash, atraumatic PSYCH:  Appropriate speech and behavior  Diagnostic and Interventional Summary    Labs Reviewed  COMPREHENSIVE METABOLIC PANEL - Abnormal; Notable for the following components:      Result Value   Potassium 2.8 (*)    Glucose, Bld 123 (*)    All other components within normal limits  NOVEL CORONAVIRUS, NAA (HOSPITAL ORDER, SEND-OUT TO REF LAB)  CBC  PROTIME-INR  TYPE AND SCREEN    No orders to display    Medications - No data to display   Procedures Critical Care  ED Course and Medical Decision Making  I have reviewed the triage vital signs and the nursing notes.  Pertinent labs & imaging results that were available during my care of the patient were reviewed by me and considered in my medical decision making (see below for details).  Post op bleeding, question of anastomotic bleeding, will consult gen surg for recs regarding imaging and other management.  Clinical Course as of Mar 29 1144  Fri Mar 30, 2019  0825 Confirmed bright red blood in stool   [MB]    Clinical Course User Index [MB] Maudie Flakes, MD    Labs overall reassuring.  Continued mild tachycardia.  Hypertensive.  Evaluated by general surgery, to be admitted for observation.  Barth Kirks. Sedonia Small, [place] [email or web address]  Final Clinical Impressions(s) / ED Diagnoses     ICD-10-CM   1. Gastrointestinal hemorrhage, unspecified gastrointestinal hemorrhage type  K92.2   2. Status post reversal of ileostomy  Z98.890     ED Discharge Orders    None         Maudie Flakes, MD 03/19/2019 1145

## 2019-03-30 NOTE — H&P (Addendum)
[facility] Admission Note  Thomas Mejia November 05, 1940  [id number].    Requesting MD: Gerlene Fee Chief Complaint: Bright red blood per rectum  Reason for Consult: Ileostomy reversal 11/21/5359 Dr. Leighton Ruff   HPI:  Patient is a 78 year old male who presented with rectal cancer in 2019.  He was found to be a T3N0.    He underwent chemotherapy robotic assisted low anterior resection with diverting loop ileostomy on 07/21/2018 by Dr. Marcello Moores.    He was readmitted underwent ileostomy reversal on 03/23/2019 by Dr. Marcello Moores.    He was discharged on 03/25/2019 and now returns with some bleeding from his rectum.  Small amount was reported yesterday.  This morning when he woke up he had a large amount of bright red blood in his underwear.  Work-up in the ED shows his blood pressures up some and is slightly tachycardic.  Labs show a potassium of 2.8, glucose of 123 his CMP is otherwise normal.  WBC is 6.5, hemoglobin is 14.4, hematocrit is 44.6, platelets are 167,000.  These numbers are actually up from his discharge labs.  CBC Latest Ref Rng & Units 03/31/2019 03/25/2019 03/24/2019  WBC 4.0 - 10.5 K/uL 6.5 6.3 7.6  Hemoglobin 13.0 - 17.0 g/dL 14.4 13.6 13.2  Hematocrit 39.0 - 52.0 % 44.6 42.5 41.0  Platelets 150 - 400 K/uL 167 145(L) 142(L)   Orthostatic BP/HR 154/103 - 97 - supine 144/103 - 111 sitting 140/102 - 126 standing   ROS: Review of Systems  Constitutional: Negative.   HENT: Negative.   Eyes: Negative.   Respiratory: Negative.   Cardiovascular: Negative.   Gastrointestinal: Positive for abdominal pain and blood in stool.       Incisional post op pain is improving, just using Tylenol  Genitourinary: Negative.   Musculoskeletal: Negative.   Skin: Negative.   Neurological: Negative.   Endo/Heme/Allergies: Negative.   Psychiatric/Behavioral: Negative.     No family history on file.  Past Medical History:  Diagnosis Date  . History of chemotherapy   . History of  inguinal hernia   . History of radiation therapy   . Rectal cancer (HCC)    7.2 x 5.6 x 7.9 cm right lateral mid rectal mass    Past Surgical History:  Procedure Laterality Date  . HERNIA REPAIR  03/05/2016  . ILEOSTOMY CLOSURE N/A 03/23/2019   Procedure: ILEOSTOMY REVERSAL ERAS PATHWAY;  Surgeon: Leighton Ruff, MD;  [facility]: [facility];  Service: General;  Laterality: N/A;  . port a cath insertion    . PROCTOSCOPY  07/21/2018   Procedure: RIGID PROCTOSCOPY;  Surgeon: Leighton Ruff, MD;  Location: [facility];  Service: General;;  . XI ROBOTIC ASSISTED LOWER ANTERIOR RESECTION N/A 07/21/2018   Procedure: XI ROBOTIC ASSISTED LOWER ANTERIOR RESECTION  DIVERTING LOOP ILEOOSTOMY ERAS PATHWAY;  Surgeon: Leighton Ruff, MD;  Location: [facility];  Service: General;  Laterality: N/A;    Social History:  reports that he has never smoked. He has never used smokeless tobacco. He reports that he does not drink alcohol or use drugs.  Allergies: No Known Allergies  Prior to Admission medications   Medication Sig Start Date End Date Taking? Authorizing Provider  acetaminophen (TYLENOL) 500 MG tablet Take 1,000 mg by mouth every 6 (six) hours as needed for mild pain.   Yes [provider]  Garlic Oil (ODORLESS GARLIC) 4431 MG CAPS Take 1,000 mg by mouth daily.   Yes [provider]  ibuprofen (ADVIL,MOTRIN) 800 MG tablet Take 400 mg  by mouth every 8 (eight) hours as needed (for pain).   Yes [provider]  MAGNESIUM PO Take 250 mg by mouth daily.    Yes [provider]  Multiple Vitamin (MULTIVITAMIN WITH MINERALS) TABS tablet Take 1 tablet by mouth daily. Centrum Silver for Adult 50+   Yes [provider]  Omega-3 Fatty Acids (FISH OIL) 1000 MG CAPS Take 1,000 mg by mouth daily.   Yes [provider]  tetrahydrozoline 0.05 % ophthalmic solution Place 1 drop into both eyes 3 (three) times daily as needed (for dry eyes).    Yes [provider]  vitamin  B-12 (CYANOCOBALAMIN) 1000 MCG tablet Take 1,000 mcg by mouth daily.   Yes [provider]  traMADol (ULTRAM) 50 MG tablet Take 1 tablet (50 mg total) by mouth every 6 (six) hours as needed (mild pain). Patient not taking: Reported on 04/05/2019 03/25/19   Ralene Ok, MD     Blood pressure (!) 173/97, pulse (!) 105, temperature 98.1 F (36.7 C), temperature source Oral, resp. rate 17, SpO2 98 %. Physical Exam: Physical Exam Constitutional:      General: He is not in acute distress.    Appearance: Normal appearance. He is normal weight. He is not ill-appearing, toxic-appearing or diaphoretic.  HENT:     Head: Normocephalic and atraumatic.     Nose: Nose normal.     Mouth/Throat:     Mouth: Mucous membranes are moist.     Pharynx: No oropharyngeal exudate or posterior oropharyngeal erythema.  Eyes:     General: No scleral icterus.       Right eye: No discharge.        Left eye: No discharge.     Comments: Pupils are equal  Neck:     Musculoskeletal: Normal range of motion and neck supple. No neck rigidity or muscular tenderness.     Vascular: No carotid bruit.  Cardiovascular:     Rate and Rhythm: Regular rhythm. Tachycardia present.     Pulses: Normal pulses.     Heart sounds: No murmur.  Pulmonary:     Effort: Pulmonary effort is normal. No respiratory distress.     Breath sounds: Normal breath sounds. No stridor. No wheezing, rhonchi or rales.  Chest:     Chest wall: No tenderness.  Abdominal:     General: Abdomen is flat. Bowel sounds are normal.     Palpations: Abdomen is soft.     Comments:  ostomy site looks fine.  Still slightly open but clean and looks good.  Lymphadenopathy:     Cervical: No cervical adenopathy.  Skin:    General: Skin is warm and dry.  Neurological:     General: No focal deficit present.     Mental Status: He is alert and oriented to person, place, and time.     Cranial Nerves: No cranial nerve deficit.  Psychiatric:        Mood  and Affect: Mood normal.        Behavior: Behavior normal.        Thought Content: Thought content normal.        Judgment: Judgment normal.     Results for orders placed or performed during the hospital encounter of 03/31/2019 (from the past 48 hour(s))  CBC     Status: None   Collection Time: 04/07/2019  8:41 AM  Result Value Ref Range   WBC 6.5 4.0 - 10.5 K/uL   RBC 5.02 4.22 - 5.81  MIL/uL   Hemoglobin 14.4 13.0 - 17.0 g/dL   HCT 44.6 39.0 - 52.0 %   MCV 88.8 80.0 - 100.0 fL   MCH 28.7 26.0 - 34.0 pg   MCHC 32.3 30.0 - 36.0 g/dL   RDW 13.8 11.5 - 15.5 %   Platelets 167 150 - 400 K/uL   nRBC 0.0 0.0 - 0.2 %    Comment: Performed at [facility], [place] [address] [place], [place] [address]  Comprehensive metabolic panel     Status: Abnormal   Collection Time: 03/23/2019  8:41 AM  Result Value Ref Range   Sodium 139 135 - 145 mmol/L   Potassium 2.8 (L) 3.5 - 5.1 mmol/L   Chloride 106 98 - 111 mmol/L   CO2 24 22 - 32 mmol/L   Glucose, Bld 123 (H) 70 - 99 mg/dL   BUN 21 8 - 23 mg/dL   Creatinine, Ser 0.91 0.61 - 1.24 mg/dL   Calcium 9.0 8.9 - 10.3 mg/dL   Total Protein 7.7 6.5 - 8.1 g/dL   Albumin 3.8 3.5 - 5.0 g/dL   AST 26 15 - 41 U/L   ALT 26 0 - 44 U/L   Alkaline Phosphatase 75 38 - 126 U/L   Total Bilirubin 0.9 0.3 - 1.2 mg/dL   GFR calc non Af Amer >60 >60 mL/min   GFR calc Af Amer >60 >60 mL/min   Anion gap 9 5 - 15    Comment: Performed at [facility], [place] [address] [place], [place] [address]  Protime-INR     Status: None   Collection Time: 03/31/2019  8:41 AM  Result Value Ref Range   Prothrombin Time 14.5 11.4 - 15.2 seconds   INR 1.1 0.8 - 1.2    Comment: (NOTE) INR goal varies based on device and disease states. Performed at [facility], [place] [address] [place], [place] [address]    No results found.    Assessment/Plan  Rectal bleeding, with orthostatic changes Rectal cancer with  chemotherapy Robotic assisted LAR, Diverting loop ileostomy, 62/8/31,DV. Leighton Ruff Ileostomy takedown 04/23/15, Dr. Leighton Ruff   Plan:  Admit and hydrate today.  Monitor VS, recheck labs in AM.    We had hoped to send home and I currently had plans for CBC Monday at [place], and follow up with Dr. Johney Maine; 04/03/19 at 11:30 AM.  [facility] Surgery [phone]  04/14/2019 9:23 AM   Agree with above.  Patient orthostatic, though Hgb okay. Plan IVF, overnight observation, repeat labs in AM.  Alphonsa Overall, MD, [facility] Surgery Pager: [phone] Office phone:  [phone]

## 2019-03-30 NOTE — ED Triage Notes (Signed)
Pt reports had colostomy reversal on June 5 and noticed darker blood in stool this morning. Denies taking blood thinners or having any pain.

## 2019-03-30 NOTE — ED Notes (Signed)
Pt able to ambulate to bathroom without assistance.   Provided lunch tray.

## 2019-03-30 NOTE — ED Notes (Signed)
ED TO INPATIENT HANDOFF REPORT  Name/Age/Gender Thomas Mejia 78 y.o. male  Code Status    Code Status Orders  (From admission, onward)         Start     Ordered   03/25/2019 1159  Full code  Continuous     04/14/2019 1158        Code Status History    Date Active Date Inactive Code Status Order ID Comments User Context   03/23/2019 1311 03/25/2019 1748 Full Code [id number]  Leighton Ruff, MD Inpatient   07/21/2018 1324 07/26/2018 2031 Full Code [id number]  Leighton Ruff, MD Inpatient   Advance Care Planning Activity      Home/SNF/Other Home  Chief Complaint rectal bleeding after surg  Level of Care/Admitting Diagnosis ED Disposition    ED Disposition Condition [facility] Area: [facility] [100102]  Level of Care: Med-Surg [16]  Covid Evaluation: N/A  Diagnosis: GI bleed [096283]  Admitting Physician: [facility], [place]  Attending Physician: [place], [place]  Bed request comments: 5 west  PT Class (Do Not Modify): Observation [104]  PT Acc Code (Do Not Modify): Observation [10022]       Medical History Past Medical History:  Diagnosis Date  . History of chemotherapy   . History of inguinal hernia   . History of radiation therapy   . Rectal cancer (HCC)    7.2 x 5.6 x 7.9 cm right lateral mid rectal mass    Allergies No Known Allergies  IV Location/Drains/Wounds Patient Lines/Drains/Airways Status   Active Line/Drains/Airways    Name:   Placement date:   Placement time:   Site:   Days:   Peripheral IV 03/20/2019 Left;Anterior Forearm   03/23/2019    0839    Forearm   less than 1   Ileostomy Loop RLQ   07/21/18    1130    RLQ   252   Incision (Closed) 07/21/18 Abdomen Other (Comment)   07/21/18    1153     252   Incision (Closed) 03/23/19 Abdomen Other (Comment)   03/23/19    1126     7   Incision - 4 Ports Abdomen Right;Lower Mid;Upper Left;Upper Left;Mid   07/21/18    0825     252          Labs/Imaging Results for orders  placed or performed during the hospital encounter of 04/13/2019 (from the past 48 hour(s))  CBC     Status: None   Collection Time: 03/29/2019  8:41 AM  Result Value Ref Range   WBC 6.5 4.0 - 10.5 K/uL   RBC 5.02 4.22 - 5.81 MIL/uL   Hemoglobin 14.4 13.0 - 17.0 g/dL   HCT 44.6 39.0 - 52.0 %   MCV 88.8 80.0 - 100.0 fL   MCH 28.7 26.0 - 34.0 pg   MCHC 32.3 30.0 - 36.0 g/dL   RDW 13.8 11.5 - 15.5 %   Platelets 167 150 - 400 K/uL   nRBC 0.0 0.0 - 0.2 %    Comment: Performed at [facility], [place] [address], [place], [place] [address]  Comprehensive metabolic panel     Status: Abnormal   Collection Time: 04/17/2019  8:41 AM  Result Value Ref Range   Sodium 139 135 - 145 mmol/L   Potassium 2.8 (L) 3.5 - 5.1 mmol/L   Chloride 106 98 - 111 mmol/L   CO2 24 22 - 32 mmol/L   Glucose, Bld  123 (H) 70 - 99 mg/dL   BUN 21 8 - 23 mg/dL   Creatinine, Ser 0.91 0.61 - 1.24 mg/dL   Calcium 9.0 8.9 - 10.3 mg/dL   Total Protein 7.7 6.5 - 8.1 g/dL   Albumin 3.8 3.5 - 5.0 g/dL   AST 26 15 - 41 U/L   ALT 26 0 - 44 U/L   Alkaline Phosphatase 75 38 - 126 U/L   Total Bilirubin 0.9 0.3 - 1.2 mg/dL   GFR calc non Af Amer >60 >60 mL/min   GFR calc Af Amer >60 >60 mL/min   Anion gap 9 5 - 15    Comment: Performed at [facility], [place] [address] [place], [place] [address]  Protime-INR     Status: None   Collection Time: 04/10/2019  8:41 AM  Result Value Ref Range   Prothrombin Time 14.5 11.4 - 15.2 seconds   INR 1.1 0.8 - 1.2    Comment: (NOTE) INR goal varies based on device and disease states. Performed at [facility], [place] [address] [place], [place] [address]   Type and screen [place]     Status: None   Collection Time: 04/14/2019  8:43 AM  Result Value Ref Range   ABO/RH(D) O POS    Antibody Screen NEG    Sample Expiration      09-Apr-2019,2359 Performed at [facility], [place] [address], [place],   [address]    No results found.  Pending Labs Unresulted Labs (From admission, onward)    Start     Ordered   03/31/19 6283  Basic metabolic panel  Tomorrow morning,   R     04/16/2019 1158   03/31/19 0500  CBC  Tomorrow morning,   R     03/19/2019 1158   04/11/2019 1201  SARS Coronavirus 2 (CEPHEID - Performed in [place] hospital lab), Hosp Order  (Asymptomatic Patients Labs)  Once,   STAT    Question:  Rule Out  Answer:  Yes   04/12/2019 1200   03/27/2019 0829  Novel Coronavirus,NAA,(SEND-OUT TO REF LAB - TAT 24-48 hrs); Hosp Order  (Asymptomatic Patients Labs)  Once,   STAT    Question:  Rule Out  Answer:  Yes   04/15/2019 0828          Vitals/Pain Today's Vitals   04/01/2019 0900 04/13/2019 1006 04/05/2019 1007 03/24/2019 1100  BP: (!) 140/97 (!) 144/103 (!) 140/102 (!) 152/93  Pulse:  (!) 101 (!) 111 93  Resp: 18  16 18   Temp:      TempSrc:      SpO2:  99% 98% 97%    Isolation Precautions No active isolations  Medications Medications  0.9 % NaCl with KCl 40 mEq / L  infusion (has no administration in time range)  acetaminophen (TYLENOL) tablet 650 mg (has no administration in time range)    Or  acetaminophen (TYLENOL) suppository 650 mg (has no administration in time range)  traMADol (ULTRAM) tablet 50 mg (has no administration in time range)  morphine 2 MG/ML injection 1 mg (has no administration in time range)  methocarbamol (ROBAXIN) tablet 500 mg (has no administration in time range)  diphenhydrAMINE (BENADRYL) 12.5 MG/5ML elixir 12.5 mg (has no administration in time range)    Or  diphenhydrAMINE (BENADRYL) injection 12.5 mg (has no administration in time range)  polyethylene glycol (MIRALAX / GLYCOLAX) packet 17 g (has no administration in time range)  ondansetron (ZOFRAN-ODT) disintegrating tablet 4 mg (has no administration in time range)    Or  ondansetron (ZOFRAN) injection 4 mg (has no administration in time range)  simethicone (MYLICON) chewable tablet 40 mg (has  no administration in time range)  pantoprazole (PROTONIX) injection 40 mg (has no administration in time range)  hydrALAZINE (APRESOLINE) injection 10 mg (has no administration in time range)    Mobility walks

## 2019-03-30 NOTE — ED Notes (Signed)
Per MD Hatcher, rapid Covid testing not necessary.  Order discontinued

## 2019-03-31 ENCOUNTER — Observation Stay (HOSPITAL_COMMUNITY): Payer: Medicare Other

## 2019-03-31 LAB — NOVEL CORONAVIRUS, NAA (HOSP ORDER, SEND-OUT TO REF LAB; TAT 18-24 HRS): SARS-CoV-2, NAA: NOT DETECTED

## 2019-03-31 LAB — CBC
HCT: 49.2 % (ref 39.0–52.0)
Hemoglobin: 16 g/dL (ref 13.0–17.0)
MCH: 28.9 pg (ref 26.0–34.0)
MCHC: 32.5 g/dL (ref 30.0–36.0)
MCV: 89 fL (ref 80.0–100.0)
Platelets: 194 10*3/uL (ref 150–400)
RBC: 5.53 MIL/uL (ref 4.22–5.81)
RDW: 14 % (ref 11.5–15.5)
WBC: 16.6 10*3/uL — ABNORMAL HIGH (ref 4.0–10.5)
nRBC: 0 % (ref 0.0–0.2)

## 2019-03-31 LAB — BASIC METABOLIC PANEL
Anion gap: 12 (ref 5–15)
BUN: 25 mg/dL — ABNORMAL HIGH (ref 8–23)
CO2: 20 mmol/L — ABNORMAL LOW (ref 22–32)
Calcium: 8.5 mg/dL — ABNORMAL LOW (ref 8.9–10.3)
Chloride: 108 mmol/L (ref 98–111)
Creatinine, Ser: 1.17 mg/dL (ref 0.61–1.24)
GFR calc Af Amer: >60 mL/min (ref >60)
GFR calc non Af Amer: >60 mL/min (ref >60)
Glucose, Bld: 181 mg/dL — ABNORMAL HIGH (ref 70–99)
Potassium: 3.8 mmol/L (ref 3.5–5.1)
Sodium: 140 mmol/L (ref 135–145)

## 2019-03-31 LAB — C DIFFICILE QUICK SCREEN W PCR REFLEX
C Diff antigen: POSITIVE — AB
C Diff interpretation: DETECTED
C Diff toxin: POSITIVE — AB

## 2019-03-31 MED ORDER — IOHEXOL 300 MG/ML  SOLN
30.0000 mL | Freq: Once | INTRAMUSCULAR | Status: AC | PRN
  Administered 2019-03-31: 18:00:00 30 mL via ORAL

## 2019-03-31 MED ORDER — VANCOMYCIN 50 MG/ML ORAL SOLUTION
125.0000 mg | Freq: Four times a day (QID) | ORAL | Status: DC
  Administered 2019-03-31 – 2019-04-01 (×2): 125 mg via ORAL
  Filled 2019-03-31 (×4): qty 2.5

## 2019-03-31 MED ORDER — PIPERACILLIN-TAZOBACTAM 3.375 G IVPB
3.3750 g | Freq: Three times a day (TID) | INTRAVENOUS | Status: DC
  Administered 2019-03-31 – 2019-04-01 (×4): 3.375 g via INTRAVENOUS
  Filled 2019-03-31 (×4): qty 50

## 2019-03-31 MED ORDER — SODIUM CHLORIDE 0.9 % IV BOLUS
1000.0000 mL | Freq: Once | INTRAVENOUS | Status: AC
  Administered 2019-03-31 – 2019-04-01 (×2): 1000 mL via INTRAVENOUS

## 2019-03-31 MED ORDER — SODIUM CHLORIDE 0.9 % IV BOLUS
500.0000 mL | Freq: Once | INTRAVENOUS | Status: AC
  Administered 2019-03-31: 500 mL via INTRAVENOUS

## 2019-03-31 NOTE — Progress Notes (Signed)
   Subjective/Chief Complaint: Patient feels well, denies abdominal pain, shortness of breath, chest pain or dyspnea on exertion.   Objective: Vital signs in last 24 hours: Temp:  [97.6 F (36.4 C)-100.8 F (38.2 C)] 98.3 F (36.8 C) (06/13 1003) Pulse Rate:  [100-140] 135 (06/13 1003) Resp:  [16-28] 24 (06/13 0647) BP: (120-151)/(75-96) 134/89 (06/13 1003) SpO2:  [95 %-100 %] 96 % (06/13 1003) Last BM Date: 03/29/2019  Intake/Output from previous day: 06/12 0701 - 06/13 0700 In: 1881.5 [P.O.:360; I.V.:1021.5; IV Piggyback:500] Out: 320 [Urine:320] Intake/Output this shift: Total I/O In: 120 [P.O.:120] Out: 0   Incision/Wound: Right lower quadrant ostomy site is packed clean and open.  Abdomen shows mild distention without rebound or guarding though.  Lab Results:  Recent Labs    04/09/2019 0841 03/31/19 0321  WBC 6.5 16.6*  HGB 14.4 16.0  HCT 44.6 49.2  PLT 167 194   BMET Recent Labs    04/10/2019 0841 03/31/19 0321  NA 139 140  K 2.8* 3.8  CL 106 108  CO2 24 20*  GLUCOSE 123* 181*  BUN 21 25*  CREATININE 0.91 1.17  CALCIUM 9.0 8.5*   PT/INR Recent Labs    03/23/2019 0841  LABPROT 14.5  INR 1.1   ABG No results for input(s): PHART, HCO3 in the last 72 hours.  Invalid input(s): PCO2, PO2  Studies/Results: No results found.  Anti-infectives: Anti-infectives (From admission, onward)   Start     Dose/Rate Route Frequency Ordered Stop   03/31/19 0800  piperacillin-tazobactam (ZOSYN) IVPB 3.375 g    Note to Pharmacy: Pharmacy to check dosing   3.375 g 12.5 mL/hr over 240 Minutes Intravenous Every 8 hours 03/31/19 0729        Assessment/Plan: Patient is a 78 year old male who presented with rectal cancer in 2019.  He was found to be a T3N0.               He underwent chemotherapy robotic assisted low anterior resection with diverting loop ileostomy on 07/21/2018 by Dr. Marcello Moores.               He was readmitted underwent ileostomy reversal on 03/23/2019  by Dr. Marcello Moores.               He was discharged on 03/25/2019 and now returns with some bleeding from his rectum.  Small amount was reported yesterday.  This morning when he woke up he had a large amount of bright red blood in his underwear.  Rectal bleeding, with orthostatic changes Rectal cancer with chemotherapy Robotic assisted LAR, Diverting loop ileostomy, 16/1/09,UE. Leighton Ruff Ileostomy takedown 01/21/39, Dr. Leighton Ruff    Bleeding seems to have stopped.  He has been tachycardic but his hemoglobin is gone up and his white count is bumped as well.  He is asymptomatic otherwise.  EKG shows normal sinus rhythm with sinus tachycardia with bilateral bundle branch block which is not new.  Will check a CT scan and hydrate for now.  Allow a diet.  He will ambulate.  Monitor for any further bleeding.    LOS: 0 days    Joyice Faster [place] Wert 03/31/2019

## 2019-03-31 NOTE — Care Management Obs Status (Signed)
[place] NOTIFICATION   Patient Details  Name: Thomas Mejia MRN: [id number] Date of Birth: Nov 24, 1940   Medicare Observation Status Notification Given:  Yes    Joaquin Courts, RN 03/31/2019, 1:18 PM

## 2019-04-01 ENCOUNTER — Inpatient Hospital Stay (HOSPITAL_COMMUNITY): Payer: Medicare Other

## 2019-04-01 DIAGNOSIS — K922 Gastrointestinal hemorrhage, unspecified: Secondary | ICD-10-CM

## 2019-04-01 DIAGNOSIS — E872 Acidosis, unspecified: Secondary | ICD-10-CM

## 2019-04-01 DIAGNOSIS — G9341 Metabolic encephalopathy: Secondary | ICD-10-CM | POA: Diagnosis not present

## 2019-04-01 DIAGNOSIS — Z66 Do not resuscitate: Secondary | ICD-10-CM | POA: Diagnosis not present

## 2019-04-01 DIAGNOSIS — E8729 Other acidosis: Secondary | ICD-10-CM

## 2019-04-01 DIAGNOSIS — I452 Bifascicular block: Secondary | ICD-10-CM | POA: Diagnosis present

## 2019-04-01 DIAGNOSIS — K625 Hemorrhage of anus and rectum: Secondary | ICD-10-CM | POA: Diagnosis present

## 2019-04-01 DIAGNOSIS — E876 Hypokalemia: Secondary | ICD-10-CM | POA: Diagnosis not present

## 2019-04-01 DIAGNOSIS — N179 Acute kidney failure, unspecified: Secondary | ICD-10-CM

## 2019-04-01 DIAGNOSIS — A0472 Enterocolitis due to Clostridium difficile, not specified as recurrent: Secondary | ICD-10-CM | POA: Diagnosis present

## 2019-04-01 DIAGNOSIS — A419 Sepsis, unspecified organism: Secondary | ICD-10-CM | POA: Diagnosis not present

## 2019-04-01 DIAGNOSIS — R6521 Severe sepsis with septic shock: Secondary | ICD-10-CM | POA: Diagnosis not present

## 2019-04-01 DIAGNOSIS — E875 Hyperkalemia: Secondary | ICD-10-CM | POA: Diagnosis not present

## 2019-04-01 DIAGNOSIS — Z923 Personal history of irradiation: Secondary | ICD-10-CM | POA: Diagnosis not present

## 2019-04-01 DIAGNOSIS — Z9221 Personal history of antineoplastic chemotherapy: Secondary | ICD-10-CM | POA: Diagnosis not present

## 2019-04-01 DIAGNOSIS — Z20828 Contact with and (suspected) exposure to other viral communicable diseases: Secondary | ICD-10-CM | POA: Diagnosis present

## 2019-04-01 DIAGNOSIS — I469 Cardiac arrest, cause unspecified: Secondary | ICD-10-CM | POA: Diagnosis not present

## 2019-04-01 DIAGNOSIS — E861 Hypovolemia: Secondary | ICD-10-CM | POA: Diagnosis not present

## 2019-04-01 DIAGNOSIS — Z85038 Personal history of other malignant neoplasm of large intestine: Secondary | ICD-10-CM | POA: Diagnosis not present

## 2019-04-01 LAB — CBC WITH DIFFERENTIAL/PLATELET
Abs Immature Granulocytes: 1.22 10*3/uL — ABNORMAL HIGH (ref 0.00–0.07)
Basophils Absolute: 0 10*3/uL (ref 0.0–0.1)
Basophils Relative: 0 %
Eosinophils Absolute: 0.1 10*3/uL (ref 0.0–0.5)
Eosinophils Relative: 0 %
HCT: 61.3 % — ABNORMAL HIGH (ref 39.0–52.0)
Hemoglobin: 18.8 g/dL — ABNORMAL HIGH (ref 13.0–17.0)
Immature Granulocytes: 4 %
Lymphocytes Relative: 4 %
Lymphs Abs: 1.2 10*3/uL (ref 0.7–4.0)
MCH: 28.6 pg (ref 26.0–34.0)
MCHC: 30.7 g/dL (ref 30.0–36.0)
MCV: 93.2 fL (ref 80.0–100.0)
Monocytes Absolute: 2.2 10*3/uL — ABNORMAL HIGH (ref 0.1–1.0)
Monocytes Relative: 8 %
Neutro Abs: 22.9 10*3/uL — ABNORMAL HIGH (ref 1.7–7.7)
Neutrophils Relative %: 84 %
Platelets: 171 10*3/uL (ref 150–400)
RBC: 6.58 MIL/uL — ABNORMAL HIGH (ref 4.22–5.81)
RDW: 15.4 % (ref 11.5–15.5)
WBC Morphology: INCREASED
WBC: 27.6 10*3/uL — ABNORMAL HIGH (ref 4.0–10.5)
nRBC: 0.2 % (ref 0.0–0.2)

## 2019-04-01 LAB — BASIC METABOLIC PANEL
Anion gap: 19 — ABNORMAL HIGH (ref 5–15)
BUN: 49 mg/dL — ABNORMAL HIGH (ref 8–23)
CO2: 11 mmol/L — ABNORMAL LOW (ref 22–32)
Calcium: 8.4 mg/dL — ABNORMAL LOW (ref 8.9–10.3)
Chloride: 110 mmol/L (ref 98–111)
Creatinine, Ser: 3.23 mg/dL — ABNORMAL HIGH (ref 0.61–1.24)
GFR calc Af Amer: 21 mL/min — ABNORMAL LOW (ref >60)
GFR calc non Af Amer: 18 mL/min — ABNORMAL LOW (ref >60)
Glucose, Bld: 129 mg/dL — ABNORMAL HIGH (ref 70–99)
Potassium: 5.4 mmol/L — ABNORMAL HIGH (ref 3.5–5.1)
Sodium: 140 mmol/L (ref 135–145)

## 2019-04-01 LAB — URINALYSIS, ROUTINE W REFLEX MICROSCOPIC
Bilirubin Urine: NEGATIVE
Glucose, UA: NEGATIVE mg/dL
Ketones, ur: 5 mg/dL — AB
Leukocytes,Ua: NEGATIVE
Nitrite: NEGATIVE
Protein, ur: 100 mg/dL — AB
Specific Gravity, Urine: 1.024 (ref 1.005–1.030)
pH: 5 (ref 5.0–8.0)

## 2019-04-01 LAB — BLOOD GAS, ARTERIAL
Acid-base deficit: 24.7 mmol/L — ABNORMAL HIGH (ref 0.0–2.0)
Bicarbonate: 8.1 mmol/L — ABNORMAL LOW (ref 20.0–28.0)
Drawn by: 514251
FIO2: 36
O2 Saturation: 93.9 %
Patient temperature: 94.8
pCO2 arterial: 39.8 mmHg (ref 32.0–48.0)
pH, Arterial: 6.92 — CL (ref 7.350–7.450)
pO2, Arterial: 106 mmHg (ref 83.0–108.0)

## 2019-04-01 LAB — LACTIC ACID, PLASMA
Lactic Acid, Venous: >11 mmol/L (ref 0.5–1.9)
Lactic Acid, Venous: 10 mmol/L (ref 0.5–1.9)
Lactic Acid, Venous: 8.8 mmol/L (ref 0.5–1.9)

## 2019-04-01 LAB — MRSA PCR SCREENING: MRSA by PCR: NEGATIVE

## 2019-04-01 LAB — TROPONIN I: Troponin I: 0.04 ng/mL (ref <0.03)

## 2019-04-01 MED ORDER — PHENYLEPHRINE HCL-NACL 10-0.9 MG/250ML-% IV SOLN
INTRAVENOUS | Status: AC
  Filled 2019-04-01: qty 250

## 2019-04-01 MED ORDER — HEPARIN SODIUM (PORCINE) 5000 UNIT/ML IJ SOLN
5000.0000 [IU] | Freq: Three times a day (TID) | INTRAMUSCULAR | Status: DC
  Administered 2019-04-01: 5000 [IU] via SUBCUTANEOUS
  Filled 2019-04-01: qty 1

## 2019-04-01 MED ORDER — SODIUM CHLORIDE 0.9 % IV BOLUS
1000.0000 mL | Freq: Once | INTRAVENOUS | Status: AC
  Administered 2019-04-01: 20:00:00 1000 mL via INTRAVENOUS

## 2019-04-01 MED ORDER — SODIUM BICARBONATE 8.4 % IV SOLN
100.0000 meq | Freq: Once | INTRAVENOUS | Status: AC
  Administered 2019-04-01: 100 meq via INTRAVENOUS

## 2019-04-01 MED ORDER — SODIUM BICARBONATE 8.4 % IV SOLN
INTRAVENOUS | Status: AC
  Administered 2019-04-01: 22:00:00 100 meq via INTRAVENOUS
  Filled 2019-04-01: qty 100

## 2019-04-01 MED ORDER — SODIUM CHLORIDE 0.9 % IV BOLUS
1000.0000 mL | Freq: Once | INTRAVENOUS | Status: AC
  Administered 2019-04-01: 17:00:00 1000 mL via INTRAVENOUS

## 2019-04-01 MED ORDER — VANCOMYCIN HCL 500 MG IV SOLR
500.0000 mg | Freq: Four times a day (QID) | Status: DC
  Filled 2019-04-01 (×7): qty 500

## 2019-04-01 MED ORDER — METOPROLOL TARTRATE 5 MG/5ML IV SOLN: 5.0000 mg | Freq: Once | INTRAVENOUS | Status: AC

## 2019-04-01 MED ORDER — PIPERACILLIN-TAZOBACTAM 3.375 G IVPB
3.3750 g | Freq: Three times a day (TID) | INTRAVENOUS | Status: DC
  Administered 2019-04-01: 3.375 g via INTRAVENOUS
  Filled 2019-04-01: qty 50

## 2019-04-01 MED ORDER — SODIUM CHLORIDE 0.9 % IV SOLN
INTRAVENOUS | Status: DC
  Administered 2019-04-01: 20:00:00 via INTRAVENOUS

## 2019-04-01 MED ORDER — KCL IN DEXTROSE-NACL 20-5-0.45 MEQ/L-%-% IV SOLN
INTRAVENOUS | Status: DC
  Administered 2019-04-01: 10:00:00 via INTRAVENOUS
  Filled 2019-04-01: qty 1000

## 2019-04-01 MED ORDER — PIPERACILLIN-TAZOBACTAM 3.375 G IVPB 30 MIN
3.3750 g | Freq: Once | INTRAVENOUS | Status: AC
  Administered 2019-04-01: 3.375 g via INTRAVENOUS
  Filled 2019-04-01: qty 50

## 2019-04-01 MED ORDER — PLASMA-LYTE 148 IV SOLN
INTRAVENOUS | Status: DC
  Administered 2019-04-01: 22:00:00 via INTRAVENOUS
  Filled 2019-04-01 (×2): qty 500

## 2019-04-01 MED ORDER — SODIUM CHLORIDE 0.9 % IV BOLUS
1000.0000 mL | Freq: Once | INTRAVENOUS | Status: AC
  Administered 2019-04-01: 1000 mL via INTRAVENOUS

## 2019-04-01 MED ORDER — SODIUM CHLORIDE 0.9% FLUSH: 10.0000 mL | INTRAVENOUS | Status: DC | PRN

## 2019-04-01 MED ORDER — ENOXAPARIN SODIUM 40 MG/0.4ML ~~LOC~~ SOLN
40.0000 mg | SUBCUTANEOUS | Status: DC
  Administered 2019-04-01: 40 mg via SUBCUTANEOUS
  Filled 2019-04-01: qty 0.4

## 2019-04-01 MED ORDER — STERILE WATER FOR INJECTION IV SOLN
INTRAVENOUS | Status: DC
  Filled 2019-04-01: qty 850

## 2019-04-01 MED ORDER — METOPROLOL TARTRATE 5 MG/5ML IV SOLN
5.0000 mg | Freq: Once | INTRAVENOUS | Status: AC
  Administered 2019-04-01: 5 mg via INTRAVENOUS

## 2019-04-01 MED ORDER — NOREPINEPHRINE BITARTRATE 1 MG/ML IV SOLN
0.0000 ug/min | INTRAVENOUS | Status: DC
  Administered 2019-04-01: 20 ug/min via INTRAVENOUS
  Filled 2019-04-01 (×2): qty 4

## 2019-04-01 MED ORDER — PHENYLEPHRINE HCL-NACL 10-0.9 MG/250ML-% IV SOLN
0.0000 ug/min | INTRAVENOUS | Status: DC
  Administered 2019-04-01: 20 ug/min via INTRAVENOUS

## 2019-04-01 MED ORDER — PANTOPRAZOLE SODIUM 40 MG PO TBEC
40.0000 mg | DELAYED_RELEASE_TABLET | Freq: Every day | ORAL | Status: DC
  Administered 2019-04-01: 40 mg via ORAL
  Filled 2019-04-01: qty 1

## 2019-04-01 MED ORDER — SODIUM CHLORIDE 0.9 % IV SOLN: INTRAVENOUS | Status: DC | PRN

## 2019-04-01 MED ORDER — VANCOMYCIN 50 MG/ML ORAL SOLUTION
500.0000 mg | Freq: Four times a day (QID) | ORAL | Status: DC
  Filled 2019-04-01 (×2): qty 10

## 2019-04-01 MED ORDER — VANCOMYCIN 50 MG/ML ORAL SOLUTION
500.0000 mg | Freq: Four times a day (QID) | ORAL | Status: DC
  Administered 2019-04-01: 500 mg
  Filled 2019-04-01 (×3): qty 10

## 2019-04-01 MED ORDER — METOPROLOL TARTRATE 5 MG/5ML IV SOLN
INTRAVENOUS | Status: AC
  Administered 2019-04-01: 5 mg via INTRAVENOUS
  Filled 2019-04-01: qty 5

## 2019-04-01 MED ORDER — SODIUM BICARBONATE 8.4 % IV SOLN
INTRAVENOUS | Status: DC
  Administered 2019-04-01: 22:00:00 via INTRAVENOUS
  Filled 2019-04-01 (×2): qty 150

## 2019-04-01 NOTE — Progress Notes (Signed)
Order noted for a-line insertion.  CCM at bedside and made aware that after four attempts by two therapists a-line insertion was unsuccessful.  No new orders at this time.

## 2019-04-01 NOTE — Progress Notes (Signed)
Rapid response nurse called to assess pt. Pt presents cool to the touch, POX trending downward, resp rate increasing.  Notified Dr. Lucia Gaskins of rapid response call. Labs ordered and await results. Will continue to monitor

## 2019-04-01 NOTE — Progress Notes (Signed)
Critical lab Lactic acid >11 Result called to Dr. Lucia Gaskins. Pt to be moved to ICU

## 2019-04-01 NOTE — Progress Notes (Addendum)
[facility] Office:  [phone] General Surgery Progress Note   LOS: 0 days  POD -     Chief Complaint: Rectal bleed  Assessment and Plan: 1.  C. Diff colitis  Vancomycin - 6/13 >>>  Repeat CBC in AM  2.  Sepsis - tachycardia, orthostatic changes, diarrhea, leukocytosis  This seems to be improving - but needs IVF for support 3.  Ileostomy reversal on 03/23/2019 by Dr. Marcello Moores.   Robotic assisted low anterior resection with diverting loop ileostomy on 07/21/2018 by Dr. Marcello Moores.   For rectal cancer 4.  Hypokalemia - better  Will adjust IVF  Recheck BMP in AM 5.  DVT prophylaxis - start lovenox  [Called by rapid response around 15:40 because he is not doing as well as he was this AM.  Lactic Acid - 11 on labs.  The other labs have to be redrawn, not enough blood. Probably more severe colitis that originally appreciated. Will restart Zosyn, give more IVF, move to step down ICU, and ask hospitalist for consultation. I spoke to the the son, Osvaldo Lamping, about the diagnosis and prognosis.]   Active Problems:   GI bleed  Subjective:  Doing okay.  Eating regular food.  Stools still loose and a little bloody.  Objective:   Vitals:   04/01/19 0455 04/01/19 0617  BP: (!) 138/92   Pulse: 88 (!) 128  Resp: 18 (!) 23  Temp: 97.6 F (36.4 C)   SpO2: 97%      Intake/Output from previous day:  06/13 0701 - 06/14 0700 In: 3642.6 [P.O.:600; I.V.:1892.6; IV Piggyback:1150] Out: 575 [Urine:325; Stool:250]  Intake/Output this shift:  No intake/output data recorded.   Physical Exam:   General: WN AA M who is alert and oriented.    HEENT: Normal. Pupils equal. .   Lungs: Clear   Abdomen: Soft, but mild distention.  Has BS.   Wound: RLQ wound okay   Lab Results:    Recent Labs    04/08/2019 0841 03/31/19 0321  WBC 6.5 16.6*  HGB 14.4 16.0  HCT 44.6 49.2  PLT 167 194    BMET   Recent Labs    03/20/2019 0841 03/31/19 0321  NA 139 140  K 2.8* 3.8  CL 106  108  CO2 24 20*  GLUCOSE 123* 181*  BUN 21 25*  CREATININE 0.91 1.17  CALCIUM 9.0 8.5*    PT/INR   Recent Labs    04/17/2019 0841  LABPROT 14.5  INR 1.1    ABG  No results for input(s): PHART, HCO3 in the last 72 hours.  Invalid input(s): PCO2, PO2   Studies/Results:  Ct Abdomen Pelvis Wo Contrast  Result Date: 03/31/2019 CLINICAL DATA:  78 year old male with history of ostomy reversal, and concern for abscess EXAM: CT ABDOMEN AND PELVIS WITHOUT CONTRAST TECHNIQUE: Multidetector CT imaging of the abdomen and pelvis was performed following the standard protocol without IV contrast. COMPARISON:  01/08/2019 FINDINGS: Lower chest: No acute finding of the lower chest. Dependent atelectasis. Hiatal hernia remains unchanged. Hepatobiliary: Unremarkable liver. Gallbladder decompressed with no evidence of radiopaque cholelithiasis. Pancreas: Unremarkable pancreas Spleen: Unremarkable spleen Adrenals/Urinary Tract: Unremarkable adrenal glands. Bilateral kidneys with no hydronephrosis or nephrolithiasis. Low-density cyst on the medial right kidney cortex unchanged. Unremarkable urinary bladder. Stomach/Bowel: Hiatal hernia.  Unremarkable stomach. Surgical changes of ostomy reversal. Enteric contrast present within stomach, small bowel, reaching the mid small bowel. The surgical anastomosis in the mid abdomen appears to be just above native terminal ileum, with  what appears to be preservation of the native ileocecal valve. The small bowel at the terminal ileum distal to the anastomosis is circumferentially thickened and decompressed. There is then circumferential thickening of the entire colon from right colon through transverse colon, descending colon to the rectal anastomosis. Fluid-filled colon throughout. No pneumatosis. Loops of small bowel partially distended. There are loops of small bowel within the left abdomen which are partially fecalized indicating slow transit and resorption of fluid. No small  bowel pneumatosis. Vascular/Lymphatic: Minimal atherosclerosis of the abdominal aorta. Reproductive: Unremarkable prostate. Other: Fat containing left inguinal hernia with small hydrocele/ascites new from the prior. No ventral hernia. Musculoskeletal: No acute displaced fracture. Degenerative changes of the thoracolumbar spine. IMPRESSION: Evidence of pan-colitis, as well as terminal ileitis, with circumferential colonic wall/bowel wall thickening from the surgical anastomosis through to the rectal anastomosis. Etiology uncertain on this noncontrast CT, potentially infectious, inflammatory, or, less likely, ischemic. Small bowel loops borderline distended with multiple air-fluid levels, favored to be either partial obstruction or ileus. Short segment of small bowel within the left abdomen demonstrates partial fecalization, indicating slow transit of material/stasis. No discrete transition point or obstruction identified. Surgical changes of ileostomy reversal, as well as remote surgical changes of rectal anastomosis. Electronically Signed   By: Corrie Mckusick D.O.   On: 03/31/2019 16:46     Anti-infectives:   Anti-infectives (From admission, onward)   Start     Dose/Rate Route Frequency Ordered Stop   03/31/19 2245  vancomycin (VANCOCIN) 50 mg/mL oral solution 125 mg     125 mg Oral 4 times daily 03/31/19 2234 04/10/19 2159   03/31/19 0800  piperacillin-tazobactam (ZOSYN) IVPB 3.375 g    Note to Pharmacy: Pharmacy to check dosing   3.375 g 12.5 mL/hr over 240 Minutes Intravenous Every 8 hours 03/31/19 0729        Alphonsa Overall, MD, FACS Pager: [facility] Office: [phone] 04/01/2019

## 2019-04-01 NOTE — Consult Note (Addendum)
..   NAME:  Thomas Mejia, MRN:  [id number], DOB:  1941/04/19, LOS: 0 ADMISSION DATE:  03/19/2019, CONSULTATION DATE:  04/01/2019 REFERRING MD:  Lucia Gaskins MD, CHIEF COMPLAINT:  Rectal Bleeding, recent RRT   Brief History   78 yo M w/ PMHx sig for rectal cancer in 2019 T3N0 s/p Ileostomy reversal on 03/23/2019 that presented to [facility] on 6/12 w/ rectal bleeding. Now hypotensive initially fluid responsive but LA >10 and SBP in 60s PCCM consulted to assist in mgmt of septic shock  History of present illness   (pt unable to give detailed history oriented to self only- HPI obtained from family, EMR and acct of other providers)  78 yo M w/ PMHx rectal cancer diagnosed in 2019 after a colonoscopy revealed a distal rectal mass 2-3 cm from the anal verge ( mass measured 7.2 x 5.6 x 7.9 cm) which was biopsied and found to be adenocarcinoma. MRI showed a T3 NO mass with no externalization of tumor outside of mesorectal envelope. S/p diverting ileostomy   s/p radiation and chemotherapy.  More recently on 03/23/2019 he had a reversal of diverting ileostomy.  He presented to [facility] on 6/12 w/ rectal bleeding and denied any other symptoms at the time of admission: (no dizziness, no abdominal pain, no chest pain, SOB, cough, fever). Negative for SARSCOV2.  Patient was admitted by surgery and started on  IVF. Stool studies were + C.diff toxin and antigen on 6/13 and pt was started on oral vancomycin.   RRT on 6/14 for hypotension.  Pt was initially fluid responsive initial lactate >11 ( now 10) and SBP in 60s Pt received 4.5L 0.9%NS. On oral vancomycin and enema due to recent CT findings of partial SBO and concerns for impaired transit. PCCM consulted to assist in mgmt of septic shock started pt on levophed via R sided port.   Past Medical History  .Marland Kitchen Active Ambulatory Problems    Diagnosis Date Noted  . Rectal cancer ([place]) 07/21/2018  . Ileostomy present ([place]) 03/23/2019   Resolved Ambulatory Problems    Diagnosis  Date Noted  . No Resolved Ambulatory Problems   Past Medical History:  Diagnosis Date  . History of chemotherapy   . History of inguinal hernia   . History of radiation therapy      Significant Hospital Events   RRT on 6/14>>>>hypotension received IVF bolus and was transferred to ICU SD S/P 3.5 L>>>>sbp 60 LA 10  Consults:  PCCM>>>>6/14 Internal Medicine/ Hospitalist>>6/14  Procedures:  Prior to this admission Ileostomy reversal on 03/23/2019   Significant Diagnostic Tests:  LA >11>>>>>10 Trop 0.04 AG met acidosis>>>> AG 19 HCO3- 11 K+ 5.4 BUN 40 Creat 3.23 Hgb 18.8 Hct 61 Plts 171 WBC 27.6  CT ABDOMEN PELVIS 03/31/2019 IMPRESSION: Evidence of pan-colitis, as well as terminal ileitis, with circumferential colonic wall/bowel wall thickening from the surgical anastomosis through to the rectal anastomosis. Etiology uncertain on this noncontrast CT, potentially infectious, inflammatory, or, less likely, ischemic. Small bowel loops borderline distended with multiple air-fluid levels, favored to be either partial obstruction or ileus. Short segment of small bowel within the left abdomen demonstrates partial fecalization, indicating slow transit of material/stasis. No discrete transition point or obstruction identified. Surgical changes of ileostomy reversal, as well as remote surgical changes of rectal anastomosis.  Micro Data:  SARSCOV2 NEGATIVE>>>> 6/14 (2 tests) C. Diff toxin and antigen positive on 6/13  Antimicrobials:  Oral Vancomycin>>>>>6/13 Vancomycin Enema >>>>6/14  Zosyn>>>>>> 6/14   Objective   Blood pressure Marland Kitchen)  60/40, pulse (!) 112, temperature (!) 94.8 F (34.9 C), temperature source Rectal, resp. rate (!) 32, height '6\' 1"'  (1.854 m), SpO2 97 %.        Intake/Output Summary (Last 24 hours) at 04/01/2019 2029 Last data filed at 04/01/2019 1400 Gross per 24 hour  Intake 3526.88 ml  Output 600 ml  Net 2926.88 ml   There were no vitals filed for this  visit.  Examination: General: awake alert not in distress HENT: normocephalic NGT in place clamped Lungs: decreased breath sounds at bases bilaterally w/ min crackles no wheezing Cardiovascular: S1 and S2 appreciated Abdomen: tight, non distended abdomen non tender Extremities: +2 edema in lower ext Neuro: alert awake oriented to self, no focal deficit, incoherent language at times GU: indwelling foley cath   Assessment & Plan:  1. Septic Shock vs Hypovolemic shock Source C. Diff colitis Recent imaging shows partial SBO s.p Ileostomy reversal on 03/23/2019  Pt on both oral Vancomycin and enema In addition Volume depleted from Gi losses Plan: MAP goal >58mHg Recommend attempting an A-line for hemodynamic montioring Received 4.5L IVF of NS Start on vasopressors Titrate for map goal Pt has had blood cx on 6/14>>will f/u continue on Zosyn IV in addition to Vancomycin Additional IVF>>> increased amt of NS can lead to hyperchloremic metabolic acidosis and pt has an anion gap metabolic acidosis already. Alternative isoosmotic/isotoonic fluids can be used at this time.  2. Anion Gap Metabolic Acidosis >>> secondary to lactic acidosis ABG: 6.92/39/106/8 Plan: Repeat metabolic panel Trend Lactate Continue with IVF hydration  Start on Bicarb gtt   3. AKI Hypoperfusion >>possible ATN Hyperkalemia Plan: Place indwelling foley catheter Strict Is and Os  Continue IVF hydration F/u repeat K+ on next met panel Bicarb gtt Given acidosis hyperkalemia and decreased UOP   Consulted Nephrology discussed with Dr Schertz>> if pt does not improve with IVF, Bicarb gtt and vasopressors may consider CRRT.   4. Mild encephalopathy Pt is verbal awake and alert oriented to self only per RN this is not new Not in acute distress Plan: Metabolic derangements may worsen sensorium Correct underlying acidosis and BP Continue with neurochecks encouraged RN to orient patient frequently   Best  practice:  Diet: NPO Pain/Anxiety/Delirium protocol (if indicated): assess pain scale avoid sedative meds VAP protocol (if indicated): not intubated DVT prophylaxis: currently Lovenox switch to heparin given AKI and worsening GFR GI prophylaxis: PPI Glucose control: if BG exceeds 1828mdl will start ISS Mobility: bedrest Code Status: Full Family Communication: Wife SaClarise [phone] is sharl and two sons bruce and albert ( phone numbers on facesheet) Disposition: ICU  Labs   CBC: Recent Labs  Lab 04/06/2019 0841 03/31/19 0321 04/01/19 1509  WBC 6.5 16.6* 27.6*  NEUTROABS  --   --  22.9*  HGB 14.4 16.0 18.8*  HCT 44.6 49.2 61.3*  MCV 88.8 89.0 93.2  PLT 167 194 17557  Basic Metabolic Panel: Recent Labs  Lab 04/11/2019 0841 03/31/19 0321 04/01/19 1654  NA 139 140 140  K 2.8* 3.8 5.4*  CL 106 108 110  CO2 24 20* 11*  GLUCOSE 123* 181* 129*  BUN 21 25* 49*  CREATININE 0.91 1.17 3.23*  CALCIUM 9.0 8.5* 8.4*   GFR: Estimated Creatinine Clearance: 24.2 mL/min (A) (by C-G formula based on SCr of 3.23 mg/dL (H)). Recent Labs  Lab 03/24/2019 0841 03/31/19 0321 04/01/19 1509 04/01/19 1800  WBC 6.5 16.6* 27.6*  --   LATICACIDVEN  --   --  >  11.0* 10.0*    Liver Function Tests: Recent Labs  Lab 03/28/2019 0841  AST 26  ALT 26  ALKPHOS 75  BILITOT 0.9  PROT 7.7  ALBUMIN 3.8   No results for input(s): LIPASE, AMYLASE in the last 168 hours. No results for input(s): AMMONIA in the last 168 hours.  ABG No results found for: PHART, PCO2ART, PO2ART, HCO3, TCO2, ACIDBASEDEF, O2SAT   Coagulation Profile: Recent Labs  Lab 04/09/2019 0841  INR 1.1    Cardiac Enzymes: Recent Labs  Lab 04/01/19 1654  TROPONINI 0.04*    HbA1C: No results found for: HGBA1C  CBG: No results for input(s): GLUCAP in the last 168 hours.  Review of Systems:   Marland KitchenMarland KitchenReview of Systems  Constitutional: Negative.   Respiratory: Negative.   Cardiovascular: Negative.    Gastrointestinal: Negative.  Negative for abdominal pain, nausea and vomiting.  Genitourinary: Negative.   Musculoskeletal: Negative.   Skin: Negative.   Neurological: Positive for weakness. Negative for focal weakness.     Past Medical History  He,  has a past medical history of History of chemotherapy, History of inguinal hernia, History of radiation therapy, and Rectal cancer ([employer]).   Surgical History    Past Surgical History:  Procedure Laterality Date  . HERNIA REPAIR  03/05/2016  . ILEOSTOMY CLOSURE N/A 03/23/2019   Procedure: ILEOSTOMY REVERSAL ERAS PATHWAY;  Surgeon: Leighton Ruff, MD;  Location: [facility];  Service: General;  Laterality: N/A;  . port a cath insertion    . PROCTOSCOPY  07/21/2018   Procedure: RIGID PROCTOSCOPY;  Surgeon: Leighton Ruff, MD;  Location: [facility];  Service: General;;  . XI ROBOTIC ASSISTED LOWER ANTERIOR RESECTION N/A 07/21/2018   Procedure: XI ROBOTIC ASSISTED LOWER ANTERIOR RESECTION  DIVERTING LOOP ILEOOSTOMY ERAS PATHWAY;  Surgeon: Leighton Ruff, MD;  Location: [facility];  Service: General;  Laterality: N/A;     Social History   reports that he has never smoked. He has never used smokeless tobacco. He reports that he does not drink alcohol or use drugs.   Family History   His family history is not on file.   Allergies No Known Allergies   Home Medications  Prior to Admission medications   Medication Sig Start Date End Date Taking? Authorizing Provider  acetaminophen (TYLENOL) 500 MG tablet Take 1,000 mg by mouth every 6 (six) hours as needed for mild pain.   Yes [provider]  Garlic Oil (ODORLESS GARLIC) 6811 MG CAPS Take 1,000 mg by mouth daily.   Yes [provider]  ibuprofen (ADVIL,MOTRIN) 800 MG tablet Take 400 mg by mouth every 8 (eight) hours as needed (for pain).   Yes [provider]  MAGNESIUM PO Take 250 mg by mouth daily.    Yes [provider]  Multiple Vitamin (MULTIVITAMIN WITH MINERALS)  TABS tablet Take 1 tablet by mouth daily. Centrum Silver for Adult 50+   Yes [provider]  Omega-3 Fatty Acids (FISH OIL) 1000 MG CAPS Take 1,000 mg by mouth daily.   Yes [provider]  tetrahydrozoline 0.05 % ophthalmic solution Place 1 drop into both eyes 3 (three) times daily as needed (for dry eyes).    Yes [provider]  vitamin B-12 (CYANOCOBALAMIN) 1000 MCG tablet Take 1,000 mcg by mouth daily.   Yes [provider]  traMADol (ULTRAM) 50 MG tablet Take 1 tablet (50 mg total) by mouth every 6 (six) hours as needed (mild pain). Patient not taking: Reported on 03/24/2019 03/25/19  Ralene Ok, MD    STAFF NOTE  I, Dr Seward Carol have personally reviewed patient's available data, including medical history, events of note, physical examination and test results as part of my evaluation. I have discussed with NP and other care providers such as pharmacist, RN and Elink.  In addition,  I personally evaluated patient The patient is critically ill with multiple organ systems failure and requires high complexity decision making for assessment and support, frequent evaluation and titration of therapies, application of advanced monitoring technologies and extensive interpretation of multiple databases.   Critical Care Time devoted to patient care services described in this note is  Minutes. This time reflects time of care of this signee Dr Seward Carol. This critical care time does not reflect procedure time, or teaching time or supervisory time but could involve care discussion time   Dr. Seward Carol Pulmonary Critical Care Medicine  04/01/2019 8:53 PM    Critical care time: 55 mins

## 2019-04-01 NOTE — Progress Notes (Signed)
CRITICAL VALUE ALERT  Critical Value:  Troponin 0.04  Date & Time Notied:  04/01/19 @ 1800   Provider Notified: Dr. Broadus John   Orders Received/Actions taken: no new orders at this time

## 2019-04-01 NOTE — Progress Notes (Addendum)
Patient BP running low SBP in 70's patient currently receiving NS  bolus. Dr. Broadus John notified. Total of 3.5 liters NS given.

## 2019-04-01 NOTE — Progress Notes (Signed)
eLink Physician-Brief Progress Note Patient Name: Thomas Mejia DOB: 1941-05-07 MRN: [id number]   Date of Service  04/01/2019  HPI/Events of Note  78 y/o M history of rectal CA s/p LAR with ileostomy recent take down. Initially re-admitted on 6/11 for rectal bleed. Pancolitis on CT. Cdiff antigen and toxin positive  eICU Interventions   Patient remains hypotensive despite 3.5 liters fluid bolus  Ordered another liter bolus and will start pressors via portacath if remains hypotensive. CCM team to see patient     Intervention Category Major Interventions: Hypotension - evaluation and management;Acute renal failure - evaluation and management Intermediate Interventions: Hypovolemia - evaluation and management Evaluation Type: New Patient Evaluation  Judd Lien 04/01/2019, 7:57 PM

## 2019-04-01 NOTE — Consult Note (Signed)
Medical Consultation   Thomas Mejia  MLY:[id number]  DOB: 01/31/1941  DOA: 03/23/2019  PCP: Lillard Anes, MD  Outpatient Specialists:   Requesting physician: Surgery Dr.Newman  Reason for consultation: Sepsis  History of Present Illness: Thomas Mejia is an 78 y.o. male with a history of rectal cancer, previously treated with chemotherapy, underwent low anterior resection with diverting loop ileostomy on 10/201 9. -Subsequently readmitted underwent ileostomy reversal on 03/23/2019 and discharged home in 2 days. -Readmitted to [facility] hospital by general surgery on 6/11 with rectal bleeding which was initially suspected to be secondary to bleeding from anastomosis. -Subsequently noted to be tachycardic yesterday general surgery obtained CT abdomen pelvis which noted pancolitis and possible partial ileus. -, This afternoon onwards patient was noted to be obtunded, tachycardic, briefly hypotensive, his lactic acid was checked which was noted to be 11, he was subsequently started on a fluid bolus and transferred to ICU, TRH was consulted for evaluation  Review of Systems:  ROS As per HPI otherwise 14 point review of systems negative.     Past Medical History: Past Medical History:  Diagnosis Date   History of chemotherapy    History of inguinal hernia    History of radiation therapy    Rectal cancer ([place])    7.2 x 5.6 x 7.9 cm right lateral mid rectal mass    Past Surgical History: Past Surgical History:  Procedure Laterality Date   HERNIA REPAIR  03/05/2016   ILEOSTOMY CLOSURE N/A 03/23/2019   Procedure: ILEOSTOMY REVERSAL ERAS PATHWAY;  Surgeon: Leighton Ruff, MD;  Location: [facility];  Service: General;  Laterality: N/A;   port a cath insertion     PROCTOSCOPY  07/21/2018   Procedure: RIGID PROCTOSCOPY;  Surgeon: Leighton Ruff, MD;  Location: [facility];  Service: General;;   XI ROBOTIC ASSISTED LOWER ANTERIOR RESECTION N/A 07/21/2018   Procedure: XI ROBOTIC ASSISTED LOWER ANTERIOR RESECTION  DIVERTING LOOP ILEOOSTOMY ERAS PATHWAY;  Surgeon: Leighton Ruff, MD;  Location: [facility];  Service: General;  Laterality: N/A;     Allergies:  No Known Allergies   Social History:  reports that he has never smoked. He has never used smokeless tobacco. He reports that he does not drink alcohol or use drugs.   Family History: No family history on file.  Physical Exam: Vitals:   04/01/19 1451 04/01/19 1456 04/01/19 1502 04/01/19 1700  BP: 123/86 123/76 (!) 93/56 121/82  Pulse: (!) 113 (!) 112 (!) 112   Resp: (!) 35 (!) 34 (!) 33 (!) 30  Temp:      TempSrc:      SpO2:        Constitutional: Somnolent, arousable, answers few questions appropriately, falls back asleep HEENT: Pupils are equal and reactive, oral mucosa is dry, neck no JVD CVS: S1-S2/tachycardic Respiratory: Decreased breath sounds at both bases Abdomen: Soft, distended, surgical incision with dressing, unable to appreciate any bowel sounds Musculoskeletal: : no edema Neuro: Cranial nerves II-XII intact, strength, sensation, reflexes Psych: Unable to assess Skin: no rashes or lesions or ulcers, no induration or nodules   Data reviewed:  I have personally reviewed following labs and imaging studies Labs:  CBC: Recent Labs  Lab 03/22/2019 0841 03/31/19 0321 04/01/19 1509  WBC 6.5 16.6* 27.6*  NEUTROABS  --   --  22.9*  HGB 14.4 16.0 18.8*  HCT 44.6 49.2 61.3*  MCV 88.8 89.0 93.2  PLT  167 194 595    Basic Metabolic Panel: Recent Labs  Lab 04/08/2019 0841 03/31/19 0321  NA 139 140  K 2.8* 3.8  CL 106 108  CO2 24 20*  GLUCOSE 123* 181*  BUN 21 25*  CREATININE 0.91 1.17  CALCIUM 9.0 8.5*   GFR Estimated Creatinine Clearance: 66.7 mL/min (by C-G formula based on SCr of 1.17 mg/dL). Liver Function Tests: Recent Labs  Lab 03/20/2019 0841  AST 26  ALT 26  ALKPHOS 75  BILITOT 0.9  PROT 7.7  ALBUMIN 3.8   No results for input(s): LIPASE,  AMYLASE in the last 168 hours. No results for input(s): AMMONIA in the last 168 hours. Coagulation profile Recent Labs  Lab 04/17/2019 0841  INR 1.1    Cardiac Enzymes: No results for input(s): CKTOTAL, CKMB, CKMBINDEX, TROPONINI in the last 168 hours. BNP: Invalid input(s): POCBNP CBG: No results for input(s): GLUCAP in the last 168 hours. D-Dimer No results for input(s): DDIMER in the last 72 hours. Hgb A1c No results for input(s): HGBA1C in the last 72 hours. Lipid Profile No results for input(s): CHOL, HDL, LDLCALC, TRIG, CHOLHDL, LDLDIRECT in the last 72 hours. Thyroid function studies No results for input(s): TSH, T4TOTAL, T3FREE, THYROIDAB in the last 72 hours.  Invalid input(s): FREET3 Anemia work up No results for input(s): VITAMINB12, FOLATE, FERRITIN, TIBC, IRON, RETICCTPCT in the last 72 hours. Urinalysis No results found for: COLORURINE, APPEARANCEUR, LABSPEC, [place], GLUCOSEU, [place], [place], KETONESUR, PROTEINUR, UROBILINOGEN, NITRITE, [place]   Microbiology Recent Results (from the past 240 hour(s))  Novel Coronavirus,NAA,(SEND-OUT TO REF LAB - TAT 24-48 hrs); Hosp Order     Status: None   Collection Time: 03/31/2019  8:29 AM   Specimen: Nasopharyngeal Swab; Respiratory  Result Value Ref Range Status   SARS-CoV-2, NAA NOT DETECTED NOT DETECTED Final    Comment: (NOTE) This test was developed and its performance characteristics determined by [employer]. This test has not been FDA cleared or approved. This test has been authorized by FDA under an Emergency Use Authorization (EUA). This test is only authorized for the duration of time the declaration that circumstances exist justifying the authorization of the emergency use of in vitro diagnostic tests for detection of SARS-CoV-2 virus and/or diagnosis of COVID-19 infection under section 564(b)(1) of the Act, 21 U.S.C. 638VFI-4(P)(3), unless the authorization is terminated or  revoked sooner. When diagnostic testing is negative, the possibility of a false negative result should be considered in the context of a patient's recent exposures and the presence of clinical signs and symptoms consistent with COVID-19. An individual without symptoms of COVID-19 and who is not shedding SARS-CoV-2 virus would expect to have a negative (not detected) result in this assay. Performed  At: [facility] [place], [place] [id number] Rush Farmer MD SA:[id number]    [place]  Final    Comment: Performed at [facility] [address], [place], [place] [address]  C difficile quick scan w PCR reflex     Status: Abnormal   Collection Time: 03/31/19  8:51 PM   Specimen: STOOL  Result Value Ref Range Status   C Diff antigen POSITIVE (A) NEGATIVE Final   C Diff toxin POSITIVE (A) NEGATIVE Final    Comment: CRITICAL RESULT CALLED TO, READ BACK BY AND VERIFIED WITH: ALLEN,P RN @1032  ON 03/31/2019 JACKSON,K    C Diff interpretation Toxin producing C. difficile detected.  Final    Comment: Performed at [facility],  [place] [address], [place], [place] [address]       Inpatient Medications:   Scheduled Meds:  enoxaparin (LOVENOX) injection  40 mg Subcutaneous Q24H   metoprolol tartrate  5 mg Intravenous Once   pantoprazole  40 mg Oral Daily   vancomycin (VANCOCIN) rectal ENEMA  500 mg Rectal Q6H   Continuous Infusions:  sodium chloride     piperacillin-tazobactam (ZOSYN)  IV     sodium chloride       Radiological Exams on Admission: Ct Abdomen Pelvis Wo Contrast  Result Date: 03/31/2019 CLINICAL DATA:  78 year old male with history of ostomy reversal, and concern for abscess EXAM: CT ABDOMEN AND PELVIS WITHOUT CONTRAST TECHNIQUE: Multidetector CT imaging of the abdomen and pelvis was performed following the standard protocol without IV contrast. COMPARISON:  01/08/2019 FINDINGS:  Lower chest: No acute finding of the lower chest. Dependent atelectasis. Hiatal hernia remains unchanged. Hepatobiliary: Unremarkable liver. Gallbladder decompressed with no evidence of radiopaque cholelithiasis. Pancreas: Unremarkable pancreas Spleen: Unremarkable spleen Adrenals/Urinary Tract: Unremarkable adrenal glands. Bilateral kidneys with no hydronephrosis or nephrolithiasis. Low-density cyst on the medial right kidney cortex unchanged. Unremarkable urinary bladder. Stomach/Bowel: Hiatal hernia.  Unremarkable stomach. Surgical changes of ostomy reversal. Enteric contrast present within stomach, small bowel, reaching the mid small bowel. The surgical anastomosis in the mid abdomen appears to be just above native terminal ileum, with what appears to be preservation of the native ileocecal valve. The small bowel at the terminal ileum distal to the anastomosis is circumferentially thickened and decompressed. There is then circumferential thickening of the entire colon from right colon through transverse colon, descending colon to the rectal anastomosis. Fluid-filled colon throughout. No pneumatosis. Loops of small bowel partially distended. There are loops of small bowel within the left abdomen which are partially fecalized indicating slow transit and resorption of fluid. No small bowel pneumatosis. Vascular/Lymphatic: Minimal atherosclerosis of the abdominal aorta. Reproductive: Unremarkable prostate. Other: Fat containing left inguinal hernia with small hydrocele/ascites new from the prior. No ventral hernia. Musculoskeletal: No acute displaced fracture. Degenerative changes of the thoracolumbar spine. IMPRESSION: Evidence of pan-colitis, as well as terminal ileitis, with circumferential colonic wall/bowel wall thickening from the surgical anastomosis through to the rectal anastomosis. Etiology uncertain on this noncontrast CT, potentially infectious, inflammatory, or, less likely, ischemic. Small bowel loops  borderline distended with multiple air-fluid levels, favored to be either partial obstruction or ileus. Short segment of small bowel within the left abdomen demonstrates partial fecalization, indicating slow transit of material/stasis. No discrete transition point or obstruction identified. Surgical changes of ileostomy reversal, as well as remote surgical changes of rectal anastomosis. Electronically Signed   By: Corrie Mckusick D.O.   On: 03/31/2019 16:46    Impression/Recommendations  Severe sepsis -Secondary to C. difficile colitis -Severe lactic acidosis, acute kidney injury, metabolic encephalopathy -Brief hypotension responsive to fluid bolus -Just completed 1 L of normal saline bolus, will need 2 more liters of saline bolus after this, followed by saline at 100 cc an hour -If becomes refractory will need pressors -Called and discussed with pulmonary critical care Dr. Elsworth Soho, they will follow remotely via E link and consult as needed -Also reviewed CT scan with Dr. Lucia Gaskins, he may have partial SBO versus ileus which may be limiting the effectiveness of oral vancomycin which was started last night, hence I have ordered vancomycin enema as well -We placed an NG tube today to administer oral vancomycin, dose increased to 500 mg every 6 for now -General surgery will follow closely,  will get another KUB in a.m. -Patient is a full code  Acute kidney injury with hyperkalemia -Due to severe sepsis as above with -needs aggressive fluid resuscitation  Rectal CA -Status post LAR with ileostomy and ileostomy reversal 6/5   Thank you for this consultation.  Our [facility] hospitalist team will follow the patient with you.   Time Spent: 1min  Domenic Polite M.D. [facility] 04/01/2019, 5:40 PM

## 2019-04-02 ENCOUNTER — Inpatient Hospital Stay (HOSPITAL_COMMUNITY): Payer: Medicare Other

## 2019-04-02 DIAGNOSIS — Z9889 Other specified postprocedural states: Secondary | ICD-10-CM

## 2019-04-02 DIAGNOSIS — I469 Cardiac arrest, cause unspecified: Secondary | ICD-10-CM

## 2019-04-02 LAB — CBC WITH DIFFERENTIAL/PLATELET
Abs Immature Granulocytes: 3.51 10*3/uL — ABNORMAL HIGH (ref 0.00–0.07)
Basophils Absolute: 0.1 10*3/uL (ref 0.0–0.1)
Basophils Relative: 0 %
Eosinophils Absolute: 0.1 10*3/uL (ref 0.0–0.5)
Eosinophils Relative: 0 %
HCT: 41.2 % (ref 39.0–52.0)
Hemoglobin: 12 g/dL — ABNORMAL LOW (ref 13.0–17.0)
Immature Granulocytes: 15 %
Lymphocytes Relative: 13 %
Lymphs Abs: 3 10*3/uL (ref 0.7–4.0)
MCH: 29 pg (ref 26.0–34.0)
MCHC: 29.1 g/dL — ABNORMAL LOW (ref 30.0–36.0)
MCV: 99.5 fL (ref 80.0–100.0)
Monocytes Absolute: 1.9 10*3/uL — ABNORMAL HIGH (ref 0.1–1.0)
Monocytes Relative: 8 %
Neutro Abs: 15.5 10*3/uL — ABNORMAL HIGH (ref 1.7–7.7)
Neutrophils Relative %: 64 %
Platelets: 84 10*3/uL — ABNORMAL LOW (ref 150–400)
RBC: 4.14 MIL/uL — ABNORMAL LOW (ref 4.22–5.81)
RDW: 14.5 % (ref 11.5–15.5)
WBC: 24 10*3/uL — ABNORMAL HIGH (ref 4.0–10.5)
nRBC: 8.2 % — ABNORMAL HIGH (ref 0.0–0.2)

## 2019-04-02 LAB — BLOOD GAS, ARTERIAL
Acid-base deficit: 22.3 mmol/L — ABNORMAL HIGH (ref 0.0–2.0)
Acid-base deficit: 14.5 mmol/L — ABNORMAL HIGH (ref 0.0–2.0)
Bicarbonate: 12.6 mmol/L — ABNORMAL LOW (ref 20.0–28.0)
Bicarbonate: 19.4 mmol/L — ABNORMAL LOW (ref 20.0–28.0)
Drawn by: 514251
Drawn by: 308601
FIO2: 100
FIO2: 100
O2 Saturation: 97.8 %
O2 Saturation: 94.1 %
Patient temperature: 98.6
Patient temperature: 36.8
pCO2 arterial: 74 mmHg (ref 32.0–48.0)
pCO2 arterial: 96.9 mmHg (ref 32.0–48.0)
pH, Arterial: 6.863 — CL (ref 7.350–7.450)
pH, Arterial: 6.931 — CL (ref 7.350–7.450)
pO2, Arterial: 171 mmHg — ABNORMAL HIGH (ref 83.0–108.0)
pO2, Arterial: 94.6 mmHg (ref 83.0–108.0)

## 2019-04-02 LAB — BASIC METABOLIC PANEL
Anion gap: 18 — ABNORMAL HIGH (ref 5–15)
BUN: 54 mg/dL — ABNORMAL HIGH (ref 8–23)
CO2: 14 mmol/L — ABNORMAL LOW (ref 22–32)
Calcium: 7.7 mg/dL — ABNORMAL LOW (ref 8.9–10.3)
Chloride: 111 mmol/L (ref 98–111)
Creatinine, Ser: 3.76 mg/dL — ABNORMAL HIGH (ref 0.61–1.24)
GFR calc Af Amer: 17 mL/min — ABNORMAL LOW (ref >60)
GFR calc non Af Amer: 15 mL/min — ABNORMAL LOW (ref >60)
Glucose, Bld: 157 mg/dL — ABNORMAL HIGH (ref 70–99)
Potassium: >7.5 mmol/L (ref 3.5–5.1)
Sodium: 143 mmol/L (ref 135–145)

## 2019-04-02 LAB — GLUCOSE, CAPILLARY
Glucose-Capillary: 39 mg/dL — CL (ref 70–99)
Glucose-Capillary: 75 mg/dL (ref 70–99)

## 2019-04-02 MED ORDER — ALTEPLASE 2 MG IJ SOLR: 2.0000 mg | Freq: Once | INTRAMUSCULAR | Status: DC | PRN

## 2019-04-02 MED ORDER — SODIUM BICARBONATE 8.4 % IV SOLN
INTRAVENOUS | Status: AC
  Filled 2019-04-02: qty 50

## 2019-04-02 MED ORDER — STERILE WATER FOR INJECTION IV SOLN
INTRAVENOUS | Status: DC
  Filled 2019-04-02 (×2): qty 150

## 2019-04-02 MED ORDER — SODIUM BICARBONATE 8.4 % IV SOLN
INTRAVENOUS | Status: AC
  Filled 2019-04-02: qty 100

## 2019-04-02 MED ORDER — SODIUM BICARBONATE 8.4 % IV SOLN: 50.0000 meq | Freq: Once | INTRAVENOUS | Status: DC

## 2019-04-02 MED ORDER — SODIUM CHLORIDE 0.9 % IV SOLN
1.2500 ng/kg/min | Freq: Once | INTRAVENOUS | Status: DC
  Filled 2019-04-02: qty 1

## 2019-04-02 MED ORDER — MIDAZOLAM HCL 2 MG/2ML IJ SOLN: 1.0000 mg | INTRAMUSCULAR | Status: DC | PRN

## 2019-04-02 MED ORDER — PRISMASOL BGK 0/2.5 32-2.5 MEQ/L IV SOLN
INTRAVENOUS | Status: DC
  Filled 2019-04-02 (×5): qty 5000

## 2019-04-02 MED ORDER — EPINEPHRINE PF 1 MG/ML IJ SOLN
0.5000 ug/min | INTRAVENOUS | Status: DC
  Filled 2019-04-02: qty 8

## 2019-04-02 MED ORDER — FENTANYL CITRATE (PF) 100 MCG/2ML IJ SOLN: 25.0000 ug | Freq: Once | INTRAMUSCULAR | Status: DC

## 2019-04-02 MED ORDER — HYDROCORTISONE NA SUCCINATE PF 100 MG IJ SOLR: 100.0000 mg | Freq: Once | INTRAMUSCULAR | Status: DC

## 2019-04-02 MED ORDER — NOREPINEPHRINE 4 MG/250ML-% IV SOLN
0.0000 ug/min | INTRAVENOUS | Status: DC
  Administered 2019-04-02: 40 ug/min via INTRAVENOUS
  Filled 2019-04-02: qty 250

## 2019-04-02 MED ORDER — NOREPINEPHRINE 16 MG/250ML-% IV SOLN
0.0000 ug/min | INTRAVENOUS | Status: DC
  Administered 2019-04-02: 40 ug/min via INTRAVENOUS
  Filled 2019-04-02: qty 250

## 2019-04-02 MED ORDER — VASOPRESSIN 20 UNIT/ML IV SOLN
0.0300 [IU]/min | INTRAVENOUS | Status: DC
  Filled 2019-04-02: qty 2

## 2019-04-02 MED ORDER — SODIUM CHLORIDE 0.9 % IV SOLN
25.0000 ug/h | INTRAVENOUS | Status: DC
  Filled 2019-04-02: qty 50

## 2019-04-02 MED ORDER — FENTANYL BOLUS VIA INFUSION
25.0000 ug | INTRAVENOUS | Status: DC | PRN
  Filled 2019-04-02: qty 25

## 2019-04-02 MED ORDER — SODIUM CHLORIDE 0.9 % FOR CRRT
INTRAVENOUS_CENTRAL | Status: DC | PRN
  Filled 2019-04-02: qty 1000

## 2019-04-02 MED ORDER — HYDROCORTISONE NA SUCCINATE PF 100 MG IJ SOLR: 50.0000 mg | Freq: Four times a day (QID) | INTRAMUSCULAR | Status: DC

## 2019-04-02 MED ORDER — HEPARIN SODIUM (PORCINE) 1000 UNIT/ML DIALYSIS
1000.0000 [IU] | INTRAMUSCULAR | Status: DC | PRN
  Filled 2019-04-02: qty 6

## 2019-04-02 MED FILL — Medication: Qty: 1 | Status: AC

## 2019-04-03 ENCOUNTER — Ambulatory Visit (HOSPITAL_COMMUNITY)
Admission: RE | Admit: 2019-04-03 | Discharge: 2019-04-03 | Disposition: A | Discharge status: Home / Self Care | Payer: Medicare Other | Source: Ambulatory Visit | Attending: Critical Care Medicine | Admitting: Critical Care Medicine

## 2019-04-03 ENCOUNTER — Other Ambulatory Visit (HOSPITAL_COMMUNITY): Payer: Self-pay | Admitting: Critical Care Medicine

## 2019-04-03 DIAGNOSIS — R0603 Acute respiratory distress: Secondary | ICD-10-CM

## 2019-04-06 LAB — CULTURE, BLOOD (ROUTINE X 2)
Culture: NO GROWTH
Culture: NO GROWTH
Special Requests: ADEQUATE
Special Requests: ADEQUATE

## 2019-04-18 NOTE — Code Documentation (Addendum)
..  CODE BLUE NOTE  Patient Name: Thomas Mejia   MRN: [id number]   Date of Birth/ Sex: [birth year] , male      Admission Date: 04/12/2019  Attending Provider: Alphonsa Overall, MD  Primary Diagnosis: <principal problem not specified>    Indication: Pt was in critical condition with septic shock secondary to C. Diff colitis, severe AG met acidosis and Acute kidney injury transferred to ICU s/p RRT  S/p cardiac arrest earlier s/p intubation Recently placed Left IJ CVC trialysis and Right femoral Arterial line Became bradycardic >>>PEA  Code began at  0414 AM   Code blue was subsequently called.   ACLS protocol started immediatley back board in place, was underway.    Technical Description:  - CPR performance duration:  21 minutes  - Was defibrillation or cardioversion used? No   - Was external pacer placed? No  - Was patient intubated pre/post CPR? Yes    Medications Administered: Y = Yes; Blank = No Amiodarone    Atropine                1  Calcium    Epinephrine                6  Lidocaine    Magnesium    Norepinephrine    Phenylephrine    Sodium bicarbonate                 4  Vasopressin    Pt was on Levophed gtt _0  Bicarb gtt (145mq) @ 1564mhr   Post CPR evaluation:  - Final Status - Was patient successfully resuscitated ? Yes - What is current rhythm? Sinus tachycardic - What is current hemodynamic status? On vasopressors   Miscellaneous Information:  - Labs sent, including: ABG  - Primary team notified?  Yes  - Family Notified? Yes  - Additional notes/ transfer status: Pt remains a full code    Jonelle Bann, KrRise PaganiniMD  03/2019/06/285:01 AM

## 2019-04-18 NOTE — Procedures (Signed)
Arterial Catheter Insertion Procedure Note [name] [id number] 28-Oct-1940  Procedure: Insertion of Arterial Catheter  Indications: Blood pressure monitoring and Frequent blood sampling  Procedure Details Consent: Unable to obtain consent because of emergent medical necessity. Time Out: Verified patient identification, verified procedure, site/side was marked, verified correct patient position, special equipment/implants available, medications/allergies/relevent history reviewed, required imaging and test results available.  Performed  Maximum sterile technique was used including antiseptics, cap, gloves, gown, hand hygiene, mask and sheet. Skin prep: Chlorhexidine; local anesthetic administered 20 gauge catheter was inserted into right femoral artery using the Seldinger technique. ULTRASOUND GUIDANCE USED: YES Evaluation Blood flow good; BP tracing good. Complications: No apparent complications.   Thomas Mejia April 27, 2019

## 2019-04-18 NOTE — Progress Notes (Signed)
Dr. Lucia Gaskins updated about patient time of death 80.

## 2019-04-18 NOTE — Significant Event (Signed)
PCCM update  CRRT was started at 5:20 AM Pt continues to be hypotensive despite multiple vasopressors Last ABG shows resp and metabolic acidosis TV and RR increased And CRRT started  Despite aggressive medical interventions prognosis is poor Discussed with family  CODE status changed to DNR Family has dicussed with RN on floor    Signed Dr Seward Carol Pulmonary Critical Care Locums

## 2019-04-18 NOTE — Death Summary Note (Addendum)
DEATH SUMMARY   Patient Details  Name: Thomas Mejia MRN: [id number] DOB: Apr 11, 1941 Attending Physician: Alphonsa Overall MD ( Surgery) Consults: Hospitalist, PCCM and Nephrology Admission/Discharge Information   Admit Date:  April 06, 2019  Date of Death:  09-Apr-2019  Time of Death:  0548 AM  Length of Stay: 1  Referring Physician: Lillard Anes, MD   Reason(s) for Hospitalization  rectal bleeding  Diagnoses  Preliminary cause of death: Refractory septic shock and severe Anion Gap Metabolic acidosis Secondary Diagnoses (including complications and co-morbidities):  Active Problems:   GI bleed   Septic shock (HCC)   C. difficile colitis   High anion gap metabolic acidosis   Lactic acidosis   Acute kidney injury ([place])   Status post reversal of ileostomy   Cardiac arrest [facility])   Brief Hospital Course (including significant findings, care, treatment, and services provided and events leading to death)  Thomas Mejia is a 78 y.o. year old male w/ PMHx sig for rectal cancer in 2018/01/16 T3N0 s/p Ileostomy reversal on 03/23/2019 that presented to [facility] on 2023-04-06 w/ rectal bleeding. Pt had a RRT on 6/14 and was hypotensive initially fluid responsive but LA >11 and SBP in 60s. Pt was transferred to [place] under Hospitalist. PCCM was consulted due to refractory LA and hypotension. Pt found to be in High anion gap metabolic acidosis with hyperkalemia secondary to Acute renal failure. At time of initial evaluation pt was alert awake oriented to self. Nephrology was consulted and pt continued on IVF with Bicarb but continued to be anuric. Overnight pt decompensated and became bradycardic and unresponsive. Went into PEA arrest.  He was intubated and started on mechanical ventilation.  Left IJ Trialysis was placed for CRRT and Right femoral A line for hemodynamic monitoring. Pt had 2 cardiac arrests following and received multiple vasopressors, bicarb gtt, and was started on CRRT but unable to tolerate  volume removal. Pt's family was constantly updated by [facility] physician. After extensive conversation with the family pt was made DNR and pronounced at 5:48 AM    Pertinent Labs and Studies  Significant Diagnostic Studies Ct Abdomen Pelvis Wo Contrast  Result Date: 03/31/2019 CLINICAL DATA:  78 year old male with history of ostomy reversal, and concern for abscess EXAM: CT ABDOMEN AND PELVIS WITHOUT CONTRAST TECHNIQUE: Multidetector CT imaging of the abdomen and pelvis was performed following the standard protocol without IV contrast. COMPARISON:  01/08/2019 FINDINGS: Lower chest: No acute finding of the lower chest. Dependent atelectasis. Hiatal hernia remains unchanged. Hepatobiliary: Unremarkable liver. Gallbladder decompressed with no evidence of radiopaque cholelithiasis. Pancreas: Unremarkable pancreas Spleen: Unremarkable spleen Adrenals/Urinary Tract: Unremarkable adrenal glands. Bilateral kidneys with no hydronephrosis or nephrolithiasis. Low-density cyst on the medial right kidney cortex unchanged. Unremarkable urinary bladder. Stomach/Bowel: Hiatal hernia.  Unremarkable stomach. Surgical changes of ostomy reversal. Enteric contrast present within stomach, small bowel, reaching the mid small bowel. The surgical anastomosis in the mid abdomen appears to be just above native terminal ileum, with what appears to be preservation of the native ileocecal valve. The small bowel at the terminal ileum distal to the anastomosis is circumferentially thickened and decompressed. There is then circumferential thickening of the entire colon from right colon through transverse colon, descending colon to the rectal anastomosis. Fluid-filled colon throughout. No pneumatosis. Loops of small bowel partially distended. There are loops of small bowel within the left abdomen which are partially fecalized indicating slow transit and resorption of fluid. No small bowel pneumatosis. Vascular/Lymphatic: Minimal atherosclerosis  of the abdominal aorta. Reproductive: Unremarkable  prostate. Other: Fat containing left inguinal hernia with small hydrocele/ascites new from the prior. No ventral hernia. Musculoskeletal: No acute displaced fracture. Degenerative changes of the thoracolumbar spine. IMPRESSION: Evidence of pan-colitis, as well as terminal ileitis, with circumferential colonic wall/bowel wall thickening from the surgical anastomosis through to the rectal anastomosis. Etiology uncertain on this noncontrast CT, potentially infectious, inflammatory, or, less likely, ischemic. Small bowel loops borderline distended with multiple air-fluid levels, favored to be either partial obstruction or ileus. Short segment of small bowel within the left abdomen demonstrates partial fecalization, indicating slow transit of material/stasis. No discrete transition point or obstruction identified. Surgical changes of ileostomy reversal, as well as remote surgical changes of rectal anastomosis. Electronically Signed   By: Corrie Mckusick D.O.   On: 03/31/2019 16:46   Dg Abd 1 View  Result Date: Apr 22, 2019 CLINICAL DATA:  78 y/o  M; NG tube placement. EXAM: ABDOMEN - 1 VIEW COMPARISON:  04/01/2019 abdomen radiographs. FINDINGS: NG tube tip projects over the gastroesophageal junction without significant change in position. Stable dilated loops of small bowel. Right central venous catheter tip projects over lower SVC. Bones are unremarkable. IMPRESSION: NG tube tip projects over the gastroesophageal junction without significant change in position. Electronically Signed   By: Kristine Garbe M.D.   On: Apr 22, 2019 02:11   Dg Abd 1 View  Result Date: 04/01/2019 CLINICAL DATA:  78 y/o  M; NG tube placement. EXAM: ABDOMEN - 1 VIEW COMPARISON:  04/01/2019 abdomen radiographs. FINDINGS: NG tube tip projects over the gastroesophageal junction without significant change in position. Dilated loops of small bowel again noted. IMPRESSION: NG tube tip  projects over gastroesophageal junction without significant change in position, advancement recommended. Electronically Signed   By: Kristine Garbe M.D.   On: 04/01/2019 20:31   Dg Abd Portable 1v  Result Date: 04/01/2019 CLINICAL DATA:  NG tube placement EXAM: PORTABLE ABDOMEN - 1 VIEW COMPARISON:  None. FINDINGS: NG tube with tip at the GE junction. Side port several cm above the GE junction. Recommend advancing 12 cm. Dilated loops of small bowel. Contrast in LEFT lower quadrant IMPRESSION: 1. NG tube at the GE junction. Recommend advancing 12 cm such that the side port is below the GE junction. 2. Dilated loops of small bowel. Contrast moving slowly through the small intestine. Electronically Signed   By: Suzy Bouchard M.D.   On: 04/01/2019 17:42    Microbiology Recent Results (from the past 240 hour(s))  Novel Coronavirus,NAA,(SEND-OUT TO REF LAB - TAT 24-48 hrs); Hosp Order     Status: None   Collection Time: 03/26/2019  8:29 AM   Specimen: Nasopharyngeal Swab; Respiratory  Result Value Ref Range Status   SARS-CoV-2, NAA NOT DETECTED NOT DETECTED Final    Comment: (NOTE) This test was developed and its performance characteristics determined by [employer]. This test has not been FDA cleared or approved. This test has been authorized by FDA under an Emergency Use Authorization (EUA). This test is only authorized for the duration of time the declaration that circumstances exist justifying the authorization of the emergency use of in vitro diagnostic tests for detection of SARS-CoV-2 virus and/or diagnosis of COVID-19 infection under section 564(b)(1) of the Act, 21 U.S.C. 269SWN-4(O)(2), unless the authorization is terminated or revoked sooner. When diagnostic testing is negative, the possibility of a false negative result should be considered in the context of a patient's recent exposures and the presence of clinical signs and symptoms consistent with COVID-19. An  individual without symptoms  of COVID-19 and who is not shedding SARS-CoV-2 virus would expect to have a negative (not detected) result in this assay. Performed  At: [facility] [place], [place] [id number] Rush Farmer MD [place]:[id number]    [place]  Final    Comment: Performed at [facility] [address], [place], [place] [address]  C difficile quick scan w PCR reflex     Status: Abnormal   Collection Time: 03/31/19  8:51 PM   Specimen: STOOL  Result Value Ref Range Status   C Diff antigen POSITIVE (A) NEGATIVE Final   C Diff toxin POSITIVE (A) NEGATIVE Final    Comment: CRITICAL RESULT CALLED TO, READ BACK BY AND VERIFIED WITH: ALLEN,P RN @1032  ON 03/31/2019 JACKSON,K    C Diff interpretation Toxin producing C. difficile detected.  Final    Comment: Performed at [facility], [place] [address], [place], [place] [address]  MRSA PCR Screening     Status: None   Collection Time: 04/01/19  7:51 PM   Specimen: Nasal Mucosa; Nasopharyngeal  Result Value Ref Range Status   MRSA by PCR NEGATIVE NEGATIVE Final    Comment:        The GeneXpert MRSA Assay (FDA approved for NASAL specimens only), is one component of a comprehensive MRSA colonization surveillance program. It is not intended to diagnose MRSA infection nor to guide or monitor treatment for MRSA infections. Performed at [facility], [place] [address], [place], [place] [address]     Lab Basic Metabolic Panel: Recent Labs  Lab 04/03/2019 0841 03/31/19 0321 04/01/19 1654 04/06/2019 0300  NA 139 140 140 143  K 2.8* 3.8 5.4* >7.5*  CL 106 108 110 111  CO2 24 20* 11* 14*  GLUCOSE 123* 181* 129* 157*  BUN 21 25* 49* 54*  CREATININE 0.91 1.17 3.23* 3.76*  CALCIUM 9.0 8.5* 8.4* 7.7*   Liver Function Tests: Recent Labs  Lab 04/05/2019 0841  AST 26  ALT 26  ALKPHOS 75  BILITOT 0.9  PROT 7.7  ALBUMIN 3.8   No  results for input(s): LIPASE, AMYLASE in the last 168 hours. No results for input(s): AMMONIA in the last 168 hours. CBC: Recent Labs  Lab 04/15/2019 0841 03/31/19 0321 04/01/19 1509 Apr 06, 2019 0300  WBC 6.5 16.6* 27.6* 24.0*  NEUTROABS  --   --  22.9* 15.5*  HGB 14.4 16.0 18.8* 12.0*  HCT 44.6 49.2 61.3* 41.2  MCV 88.8 89.0 93.2 99.5  PLT 167 194 171 84*   Cardiac Enzymes: Recent Labs  Lab 04/01/19 1654  TROPONINI 0.04*   Sepsis Labs: Recent Labs  Lab 04/04/2019 0841 03/31/19 0321 04/01/19 1509 04/01/19 1800 04/01/19 2100 04-06-19 0300  WBC 6.5 16.6* 27.6*  --   --  24.0*  LATICACIDVEN  --   --  >11.0* 10.0* 8.8*  --     Procedures/Operations  Endotracheal intubation>>>CRNA>>>6/15 Left IJ Trialysis>>>>6/15 PCCM Right femoral Arterial line>>>>6/15 PCCM   Cyril Mourning D Filicia Scogin 04/06/2019, 5:52 AM

## 2019-04-18 NOTE — Procedures (Signed)
Central Venous Catheter Insertion Procedure Note Thomas Mejia [id number] [birth year]  Procedure: Insertion of Central Venous Catheter Indications: Assessment of intravascular volume, Drug and/or fluid administration, Frequent blood sampling and CRRT  Procedure Details Consent: Unable to obtain consent because of emergent medical necessity. Time Out: Verified patient identification, verified procedure, site/side was marked, verified correct patient position, special equipment/implants available, medications/allergies/relevent history reviewed, required imaging and test results available.  Performed  Maximum sterile technique was used including antiseptics, cap, gloves, gown, hand hygiene, mask and sheet. Skin prep: Chlorhexidine; local anesthetic administered A antimicrobial bonded/coated triple lumen catheter was placed in the left internal jugular vein using the Seldinger technique.  Evaluation Blood flow good Complications: No apparent complications Patient did tolerate procedure well. Chest X-ray ordered to verify placement.  CXR: pending.  Thomas Mejia 2019-04-10, 4:43 AM

## 2019-04-18 NOTE — ED Provider Notes (Signed)
[facility]  Department of Emergency Medicine   Code Blue CONSULT NOTE  Chief Complaint: Cardiac arrest/unresponsive   Level V Caveat: Unresponsive  History of present illness: I was contacted by the hospital for a CODE BLUE cardiac arrest upstairs and presented to the patient's bedside.   Arrived to patient bedside where he was receiving assist ventilations by bag valve mask.  Staff report the patient became suddenly bradycardic into the 30s and unresponsive.  He has had suppressed mental status leading up until this time along with known acidosis and renal issue.  Patient also with admit for GI bleeding.  No additional history or lab values available on my initial evaluation.   ROS: Unable to obtain, Level V caveat  Scheduled Meds: . fentaNYL (SUBLIMAZE) injection  25 mcg Intravenous Once  . heparin injection (subcutaneous)  5,000 Units Subcutaneous Q8H  . pantoprazole  40 mg Oral Daily  . sodium bicarbonate      . sodium bicarbonate  50 mEq Intravenous Once  . vancomycin  500 mg Per Tube Q6H  . vancomycin (VANCOCIN) rectal ENEMA  500 mg Rectal Q6H   Continuous Infusions: . sodium chloride Stopped (04/01/19 2113)  . sodium chloride    . electrolyte-148 100 mL/hr at 2019-04-23 0000  . fentaNYL infusion INTRAVENOUS    . norepinephrine (LEVOPHED) Adult infusion 40 mcg/min (April 23, 2019 0212)  . piperacillin-tazobactam (ZOSYN)  IV 12.5 mL/hr at 04/23/19 0000  .  sodium bicarbonate  infusion 1000 mL 150 mL/hr at 2019/04/23 0000   PRN Meds:.Place/Maintain arterial line **AND** sodium chloride, acetaminophen **OR** acetaminophen, fentaNYL, midazolam, midazolam, ondansetron **OR** ondansetron (ZOFRAN) IV, polyethylene glycol, simethicone, sodium chloride flush, traMADol Past Medical History:  Diagnosis Date  . History of chemotherapy   . History of inguinal hernia   . History of radiation therapy   . Rectal cancer (HCC)    7.2 x 5.6 x 7.9 cm right lateral mid rectal mass    Past Surgical History:  Procedure Laterality Date  . HERNIA REPAIR  03/05/2016  . ILEOSTOMY CLOSURE N/A 03/23/2019   Procedure: ILEOSTOMY REVERSAL ERAS PATHWAY;  Surgeon: Leighton Ruff, MD;  Location: [facility] ORS;  Service: General;  Laterality: N/A;  . port a cath insertion    . PROCTOSCOPY  07/21/2018   Procedure: RIGID PROCTOSCOPY;  Surgeon: Leighton Ruff, MD;  Location: [facility] ORS;  Service: General;;  . XI ROBOTIC ASSISTED LOWER ANTERIOR RESECTION N/A 07/21/2018   Procedure: XI ROBOTIC ASSISTED LOWER ANTERIOR RESECTION  DIVERTING LOOP ILEOOSTOMY ERAS PATHWAY;  Surgeon: Leighton Ruff, MD;  Location: [facility] ORS;  Service: General;  Laterality: N/A;   Social History   Socioeconomic History  . Marital status: Married    Spouse name: Not on file  . Number of children: Not on file  . Years of education: Not on file  . Highest education level: Not on file  Occupational History  . Not on file  Social Needs  . Financial resource strain: Not on file  . Food insecurity    Worry: Not on file    Inability: Not on file  . Transportation needs    Medical: Not on file    Non-medical: Not on file  Tobacco Use  . Smoking status: Never Smoker  . Smokeless tobacco: Never Used  Substance and Sexual Activity  . Alcohol use: Never    Frequency: Never  . Drug use: Never  . Sexual activity: Not on file  Lifestyle  . Physical activity    Days per week:  Not on file    Minutes per session: Not on file  . Stress: Not on file  Relationships  . Social Herbalist on phone: Not on file    Gets together: Not on file    Attends religious service: Not on file    Active member of club or organization: Not on file    Attends meetings of clubs or organizations: Not on file    Relationship status: Not on file  . Intimate partner violence    Fear of current or ex partner: Not on file    Emotionally abused: Not on file    Physically abused: Not on file    Forced sexual activity: Not on file  Other  Topics Concern  . Not on file  Social History Narrative  . Not on file   No Known Allergies  Last set of Vital Signs (not current) Vitals:   25-Apr-2019 0000 2019/04/25 0100  BP: (!) 87/50 105/68  Pulse: (!) 102 (!) 106  Resp: (!) 39 (!) 38  Temp: (!) 96.6 F (35.9 C) 97.9 F (36.6 C)  SpO2: 96% 98%      Physical Exam  Gen: unresponsive. Assist ventilations provided.  Cardiovascular: Palpable pulse on arrival.  Resp: apneic. Breath sounds equal bilaterally with bagging  Abd: nondistended  Neuro: GCS 3, unresponsive to pain  HEENT: No blood in posterior pharynx. Some vomitus noted.  Neck: No crepitus  Musculoskeletal: No deformity  Skin: warm  Procedures  INTUBATION Performed by: Margette Fast Required items: required blood products, implants, devices, and special equipment available Patient identity confirmed: provided demographic data and hospital-assigned identification number Time out: No. Emergency procedure.  Indications: Unresponsive Intubation method: Glideoscope Preoxygenation: 98% BVM Sedatives: Etomidate Paralytic: Rocuronium  Tube Size:7.5 cuffed Post-procedure assessment: chest rise and ETCO2 monitor Breath sounds: equal and absent over the epigastrium Tube secured by Respiratory Therapy Patient tolerated the procedure well with no immediate complications.  CRITICAL CARE Performed by: Margette Fast Total critical care time: 15 Critical care time was exclusive of separately billable procedures and treating other patients. Critical care was necessary to treat or prevent imminent or life-threatening deterioration. Critical care was time spent personally by me on the following activities: development of treatment plan with patient and/or surrogate as well as nursing, discussions with consultants, evaluation of patient's response to treatment, examination of patient, obtaining history from patient or surrogate, ordering and performing treatments and  interventions, ordering and review of laboratory studies, ordering and review of radiographic studies, pulse oximetry and re-evaluation of patient's condition.  Cardiopulmonary Resuscitation (CPR) Procedure Note 16 minutes provided.  Directed/Performed by: Margette Fast I personally directed ancillary staff and/or performed CPR in an effort to regain return of spontaneous circulation and to maintain cardiac, neuro and systemic perfusion.    Medical Decision making  Arrived to patient bedside where he is receiving assist ventilations with acute mental status worsening and bradycardia.  While setting up to secure the patient's airway he had an additional bradycardic episode which appeared to be sinus on monitor.  His pulse became difficult to palpate and CPR was initiated.  Patient received paralytic and sedation medication just prior to CPR initiation.  Intubation was performed without stopping chest compressions.  There was vomitus in the airway which was suctioned.  The patient's NG tube had to be removed as it was obstructing the view of the cords which were anterior.  Intubation was successful and a CPR/ACLS was continued.  Ultimately  ROSC was achieved and critical care reassuming care of the patient.  Assessment and Plan  Patient intubated and ROSC achieved. Care transferred back to ICU team. They have discussed with family.     Margette Fast, MD 11-Apr-2019 [phone]

## 2019-04-18 DEATH — deceased

## 2020-12-17 IMAGING — DX ABDOMEN - 1 VIEW
1 series · 1 of 1 positions shown · non-contrast
Comparison: 04/01/2019 abdomen radiographs.

CLINICAL DATA: 77 y/o  M; NG tube placement.

EXAM:
ABDOMEN - 1 VIEW

[abdomen kub]
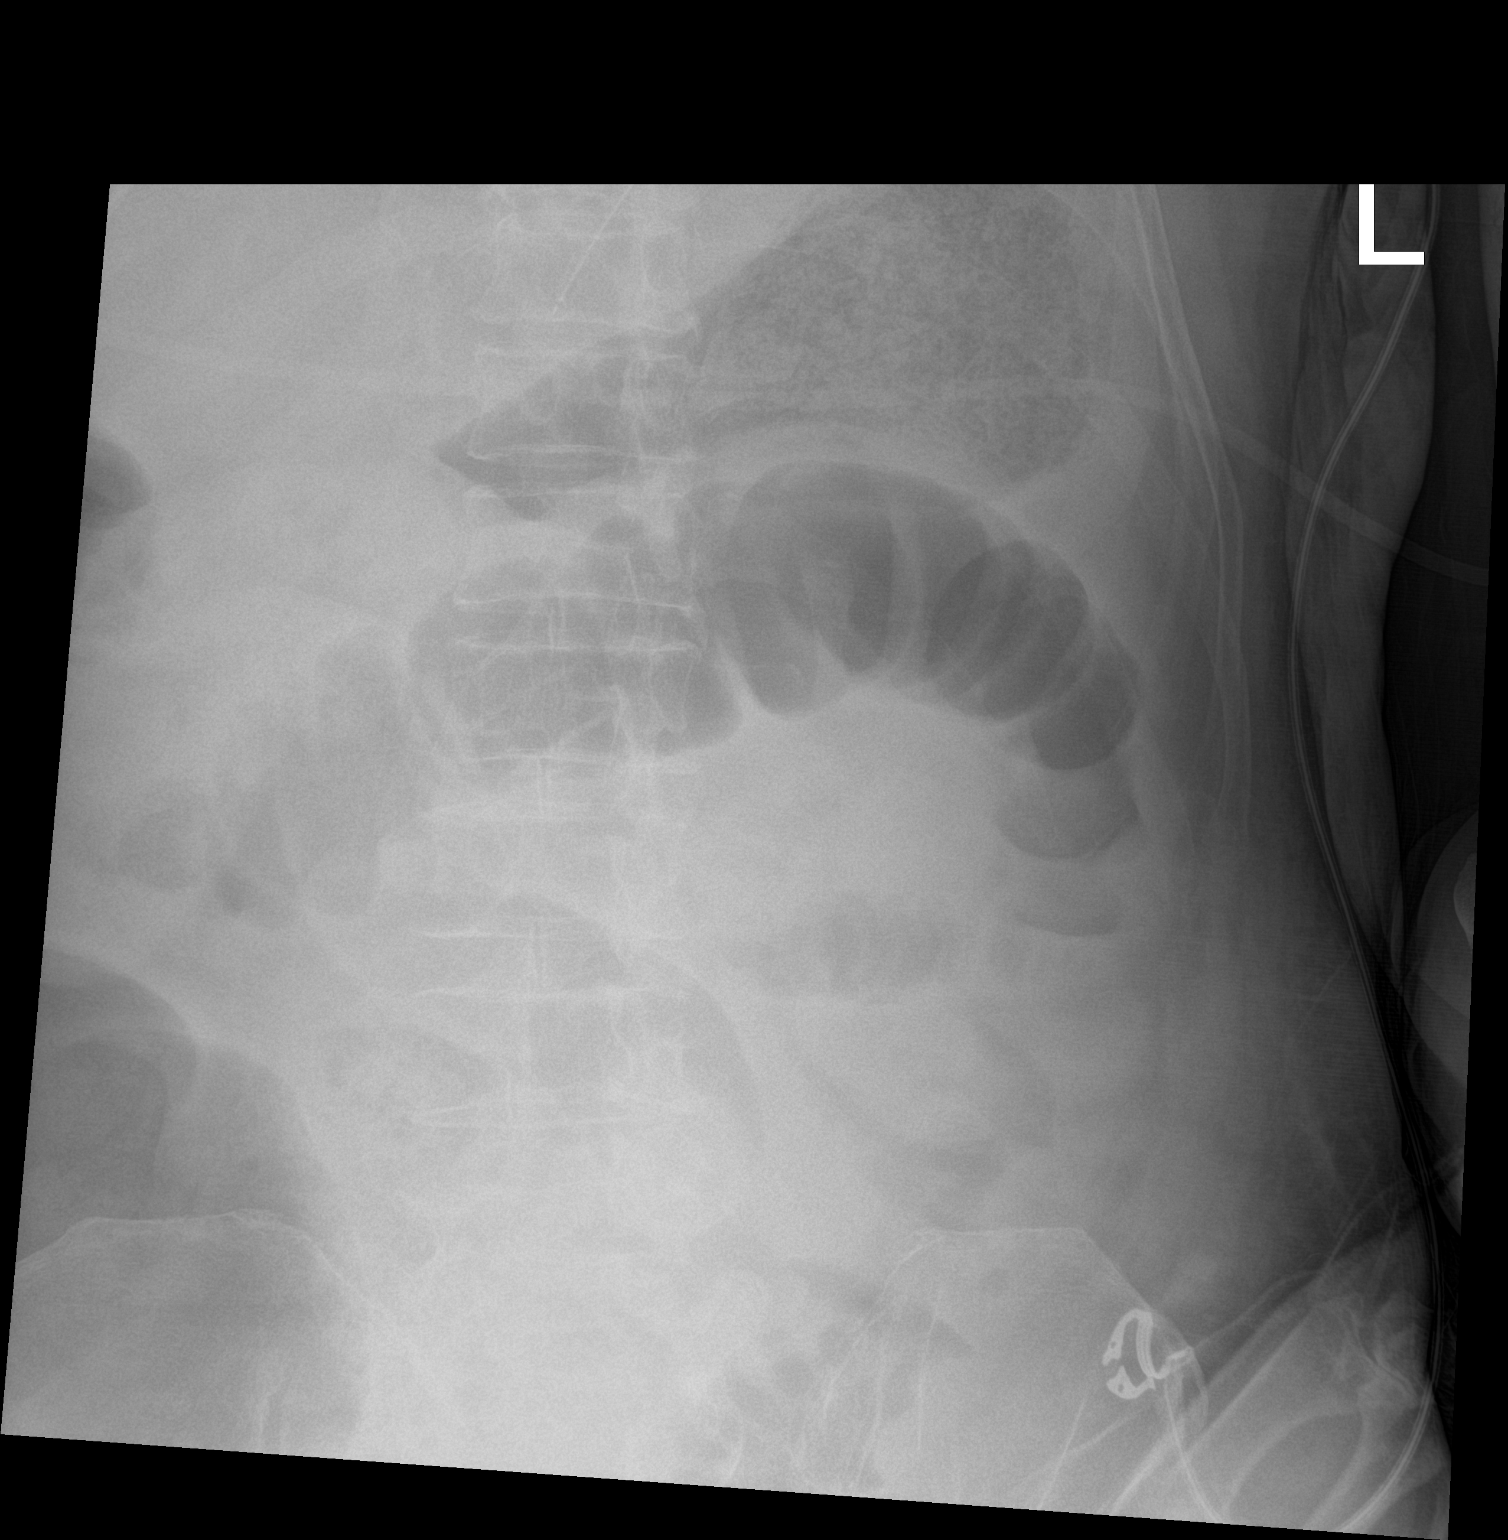

[1 of 1 positions shown; findings below may reference images not displayed]

FINDINGS: NG tube tip projects over the gastroesophageal junction without
significant change in position. Dilated loops of small bowel again
noted.
IMPRESSION: NG tube tip projects over gastroesophageal junction without
significant change in position, advancement recommended.

## 2020-12-18 IMAGING — DX PORTABLE CHEST - 1 VIEW
1 series · 1 of 1 positions shown · non-contrast
Comparison: 01/08/2019

CLINICAL DATA: Cardiac arrest.

EXAM:
PORTABLE CHEST 1 VIEW

[chest ap]
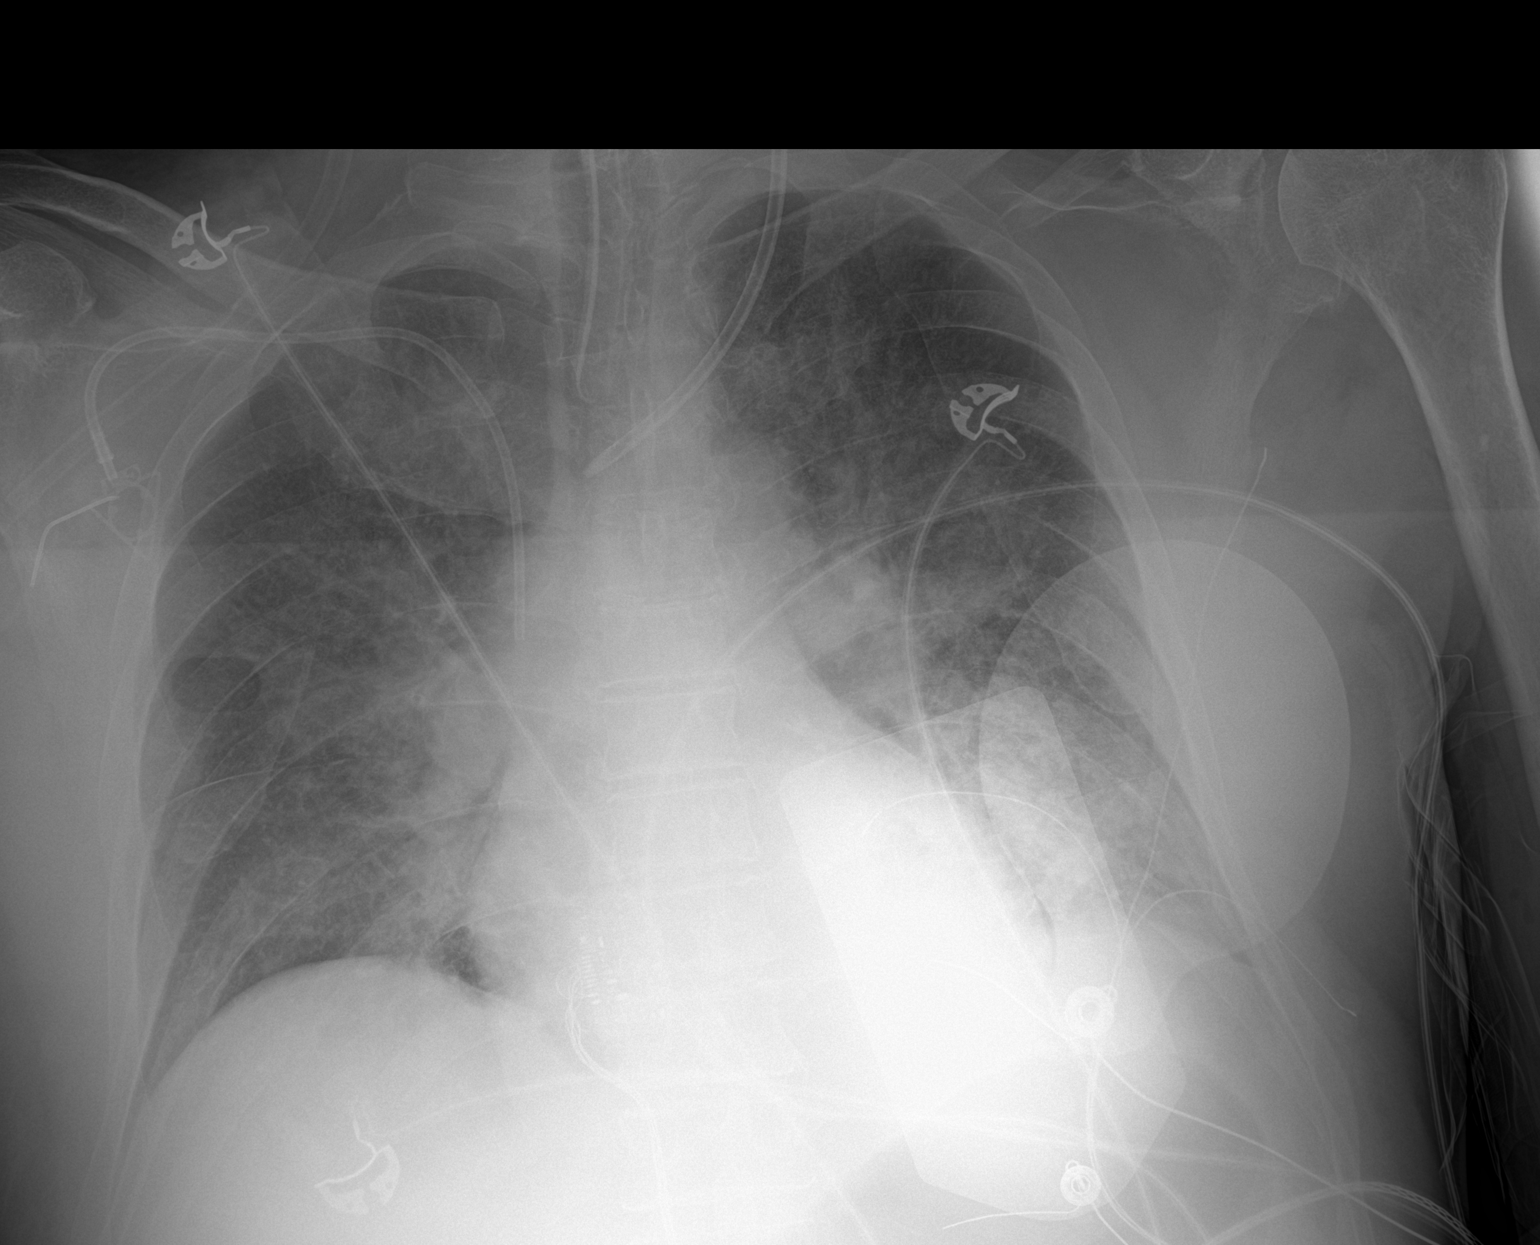

[1 of 1 positions shown; findings below may reference images not displayed]

FINDINGS: The endotracheal tube is in good position, 4.8 cm above the carina.
Left IJ central venous catheter tip is at the brachiocephalic SVC
junction region.

Right subclavian power port tip is in the mid SVC.

External pacer paddles are noted over the left chest.

Perihilar pattern of pulmonary edema but no definite infiltrates or
effusions. No pneumothorax. The bony thorax is intact.
IMPRESSION: 1. Support apparatus in good position without complicating features.
2. Perihilar pattern of pulmonary edema but no infiltrates or
effusions.

## 2020-12-18 IMAGING — DX ABDOMEN - 1 VIEW
1 series · 1 of 1 positions shown · non-contrast
Comparison: 04/01/2019 abdomen radiographs.

CLINICAL DATA: 77 y/o  M; NG tube placement.

EXAM:
ABDOMEN - 1 VIEW

[abdomen kub]
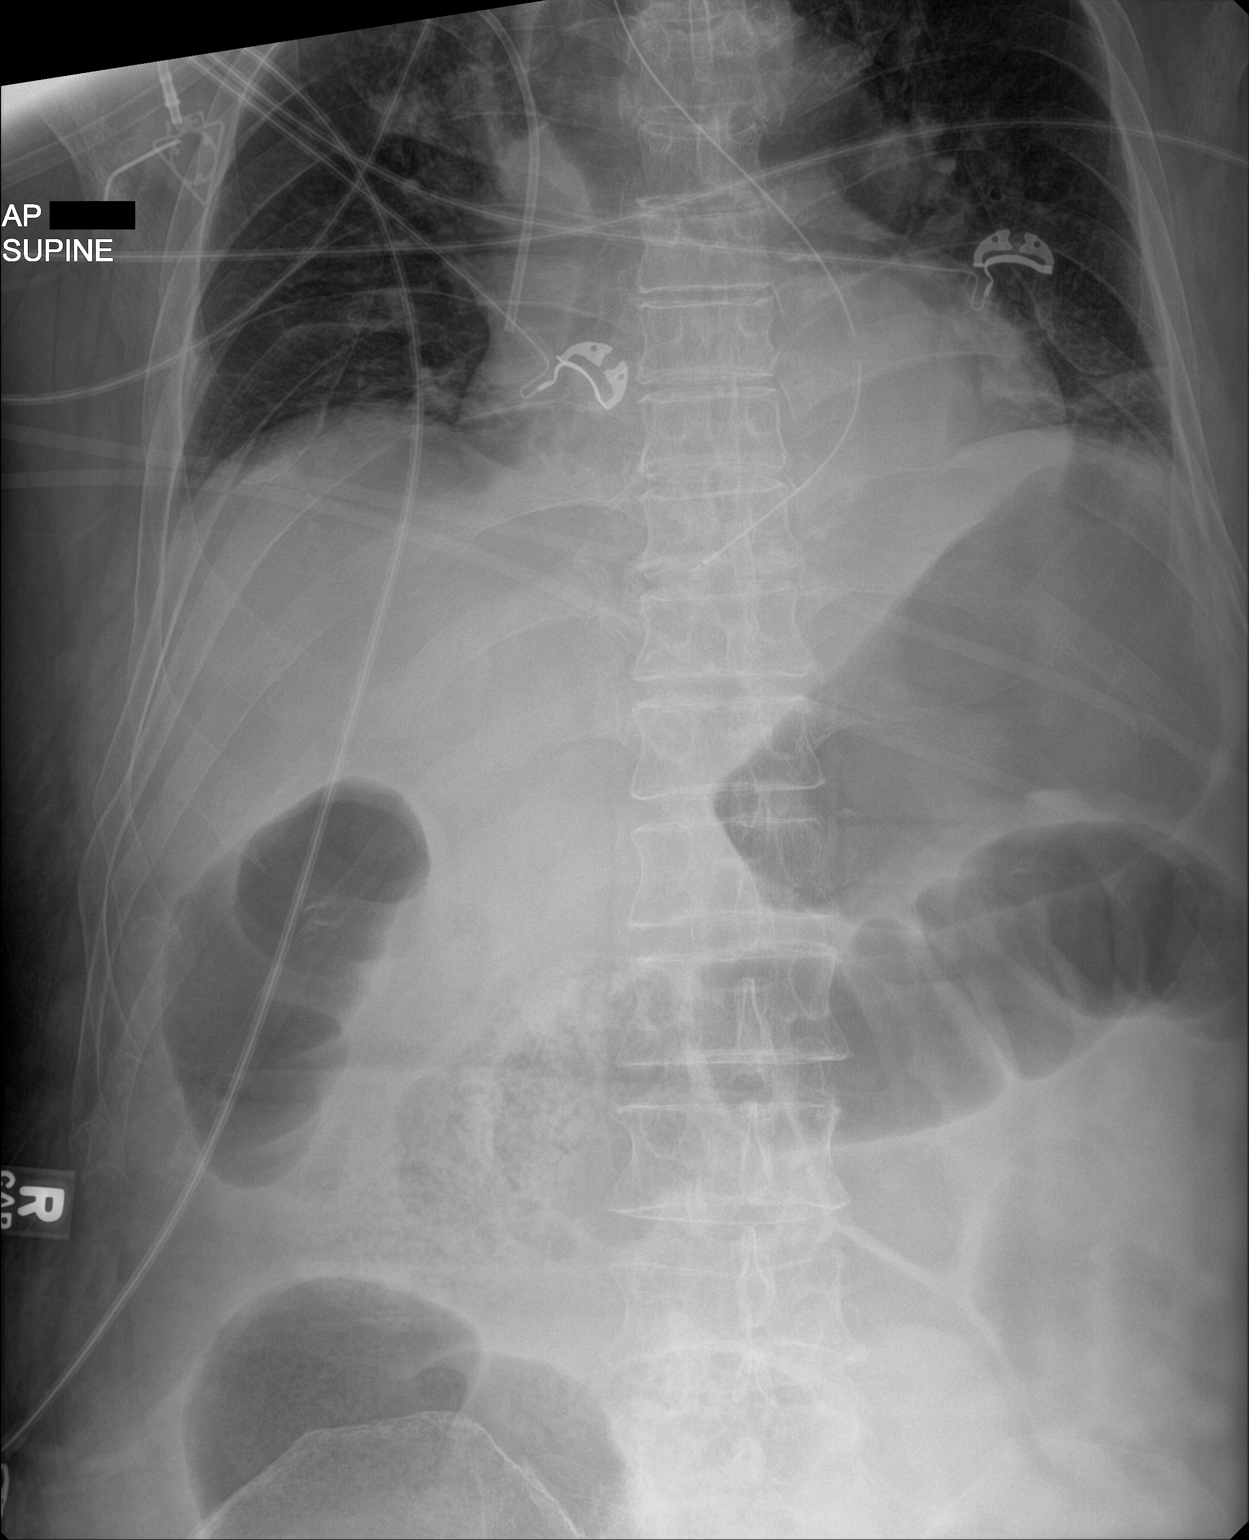

[1 of 1 positions shown; findings below may reference images not displayed]

FINDINGS: NG tube tip projects over the gastroesophageal junction without
significant change in position. Stable dilated loops of small bowel.
Right central venous catheter tip projects over lower SVC. Bones are
unremarkable.
IMPRESSION: NG tube tip projects over the gastroesophageal junction without
significant change in position.
